# Patient Record
Sex: Female | Born: 1995
Health system: Southern US, Community
[De-identification: ages and names within clinical notes are randomized; demographics above are authoritative.]

## PROBLEM LIST (undated history)

## (undated) DIAGNOSIS — D649 Anemia, unspecified: Secondary | ICD-10-CM

## (undated) DIAGNOSIS — J45909 Unspecified asthma, uncomplicated: Secondary | ICD-10-CM

## (undated) DIAGNOSIS — R569 Unspecified convulsions: Secondary | ICD-10-CM

## (undated) DIAGNOSIS — I890 Lymphedema, not elsewhere classified: Secondary | ICD-10-CM

## (undated) DIAGNOSIS — G932 Benign intracranial hypertension: Secondary | ICD-10-CM

## (undated) DIAGNOSIS — F810 Specific reading disorder: Secondary | ICD-10-CM

## (undated) DIAGNOSIS — R51 Headache: Secondary | ICD-10-CM

## (undated) DIAGNOSIS — G4733 Obstructive sleep apnea (adult) (pediatric): Secondary | ICD-10-CM

## (undated) HISTORY — DX: Lymphedema, not elsewhere classified: I89.0

## (undated) HISTORY — DX: Benign intracranial hypertension: G93.2

## (undated) HISTORY — DX: Unspecified convulsions: R56.9

## (undated) HISTORY — PX: HERNIA REPAIR: SHX51

## (undated) HISTORY — DX: Headache: R51

## (undated) HISTORY — PX: ADENOIDECTOMY: SUR15

## (undated) HISTORY — DX: Unspecified asthma, uncomplicated: J45.909

## (undated) HISTORY — DX: Anemia, unspecified: D64.9

## (undated) HISTORY — DX: Obstructive sleep apnea (adult) (pediatric): G47.33

---
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
## 2015-01-11 ENCOUNTER — Ambulatory Visit: Payer: 59 | Admitting: Pulmonary Disease

## 2015-01-15 ENCOUNTER — Ambulatory Visit (INDEPENDENT_AMBULATORY_CARE_PROVIDER_SITE_OTHER): Payer: 59 | Admitting: Adult Health

## 2015-01-15 ENCOUNTER — Encounter: Payer: Self-pay | Admitting: Adult Health

## 2015-01-15 VITALS — BP 140/90 | HR 87 | Temp 98.4°F | Ht 67.0 in | Wt 285.4 lb

## 2015-01-15 DIAGNOSIS — G4733 Obstructive sleep apnea (adult) (pediatric): Secondary | ICD-10-CM | POA: Diagnosis not present

## 2015-01-15 NOTE — Assessment & Plan Note (Signed)
Severe sleep apnea with C Pap noncompliant Encouraged her to rethink wearing C Pap. However, she has sent her machine back to the company We'll try an oral appliance to see if this possibly may help somewhat Encouraged on weight loss Grandmother and patient advised on this. We'll try to attempt to call mother to advise her of recommendations.

## 2015-01-15 NOTE — Patient Instructions (Signed)
Will try oral appliance although this is not ideal .  Work on weight loss.  Follow up Dr. Vassie LollAlva  In 3-4  months

## 2015-01-15 NOTE — Progress Notes (Signed)
   Subjective:    Patient ID: Alicia Swanson, female    DOB: December 08, 1995, 19 y.o.   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 She had a seizure disorder since childhood, mild delayed milestones require special education. She underwent tonsillectomy Alicia as a child.She underwent nocturnal polysomnogram in 12/2008 showed severe OSA, with AHI of 75 events per hour including 33 central apneas, lowest desaturation was 60%. CPAP was tried, but she did not tolerate and mom did not insist on Alicia using it, and she was returned within a few days.   Titration study  >>CPAP 15 cm  pt's mom, she says that patient complains that CPAP causes Alicia to feel like she is smothering. She doesn't like anything on Alicia face and she will pull the mask off. Mom says that she noticed a change when she was using the CPAP and she has tried everything to get Alicia to keep the machine on, but she will not keep it on. She would like any help we can offer, she doesn't know what more to do. She has tried the nasal cannulas, nasal pillows and mask for nose and mouth but she still will not use it.  09/2014 Download on auto 5-15 - no residual, poor usage  Download shows poor usage with 67% , avg 2 hr  We discussed compliance and dangers of untreated OSA  She says it is the mask.  We discussed nasal pillows -thinks this might be better.  Denies excessive daytime sleepiness Does complain of insomnia .   01/15/2015 Follow up OSA  Patient returns with Alicia Swanson for follow-up for Alicia sleep apnea Patient has underlying known severe sleep apnea Unfortunately, she has not been wearing Alicia C Pap. She says that she did not like wearing the mask and she said Alicia machine back to the home care company. Discussed with Alicia and Alicia Swanson the importance of treating sleep apnea/ We discussed the complications of  untreated sleep apnea. Did discuss an oral appliance. However, advised this is not ideal treatment for severe sleep apnea. She was encouraged on weight loss.   Review of Systems neg for any significant sore throat, dysphagia, itching, sneezing, nasal congestion or excess/ purulent secretions, fever, chills, sweats, unintended wt loss, pleuritic or exertional cp, hempoptysis, orthopnea pnd or change in chronic leg swelling. Also denies presyncope, palpitations, heartburn, abdominal pain, nausea, vomiting, diarrhea or change in bowel or urinary habits, dysuria,hematuria, rash, arthralgias, visual complaints, headache, numbness weakness or ataxia.     Objective:   Physical Exam  Gen. Pleasant, obese, in no distress ENT - no lesions, no post nasal drip Neck: No JVD, no thyromegaly, no carotid bruits Lungs: no use of accessory muscles, no dullness to percussion, decreased without rales or rhonchi  Cardiovascular: Rhythm regular, heart sounds  normal, no murmurs or gallops, no peripheral edema Musculoskeletal: No deformities, no cyanosis or clubbing , no tremors        Assessment & Plan:

## 2015-01-17 NOTE — Addendum Note (Signed)
Addended by: Boone MasterJONES, Jasamine Pottinger E on: 01/17/2015 05:14 PM   Modules accepted: Orders

## 2015-01-17 NOTE — Progress Notes (Signed)
Reviewed & agree with plan  

## 2015-11-20 ENCOUNTER — Emergency Department (HOSPITAL_COMMUNITY)
Admission: EM | Admit: 2015-11-20 | Discharge: 2015-11-20 | Disposition: A | Discharge status: Home / Self Care | Payer: 59 | Attending: Emergency Medicine | Admitting: Emergency Medicine

## 2015-11-20 ENCOUNTER — Ambulatory Visit (HOSPITAL_COMMUNITY)
Admission: RE | Admit: 2015-11-20 | Discharge: 2015-11-20 | Disposition: A | Discharge status: Home / Self Care | Payer: 59 | Attending: Psychiatry | Admitting: Psychiatry

## 2015-11-20 ENCOUNTER — Encounter (HOSPITAL_COMMUNITY): Payer: Self-pay

## 2015-11-20 DIAGNOSIS — Z041 Encounter for examination and observation following transport accident: Secondary | ICD-10-CM | POA: Diagnosis present

## 2015-11-20 DIAGNOSIS — Z8249 Family history of ischemic heart disease and other diseases of the circulatory system: Secondary | ICD-10-CM | POA: Insufficient documentation

## 2015-11-20 DIAGNOSIS — R569 Unspecified convulsions: Secondary | ICD-10-CM | POA: Insufficient documentation

## 2015-11-20 DIAGNOSIS — R6250 Unspecified lack of expected normal physiological development in childhood: Secondary | ICD-10-CM

## 2015-11-20 DIAGNOSIS — F71 Moderate intellectual disabilities: Secondary | ICD-10-CM | POA: Insufficient documentation

## 2015-11-20 DIAGNOSIS — Z825 Family history of asthma and other chronic lower respiratory diseases: Secondary | ICD-10-CM | POA: Insufficient documentation

## 2015-11-20 DIAGNOSIS — Z8669 Personal history of other diseases of the nervous system and sense organs: Secondary | ICD-10-CM | POA: Diagnosis not present

## 2015-11-20 DIAGNOSIS — Y998 Other external cause status: Secondary | ICD-10-CM | POA: Diagnosis not present

## 2015-11-20 DIAGNOSIS — G4733 Obstructive sleep apnea (adult) (pediatric): Secondary | ICD-10-CM | POA: Diagnosis not present

## 2015-11-20 DIAGNOSIS — Z823 Family history of stroke: Secondary | ICD-10-CM | POA: Diagnosis not present

## 2015-11-20 DIAGNOSIS — Z820 Family history of epilepsy and other diseases of the nervous system: Secondary | ICD-10-CM | POA: Diagnosis not present

## 2015-11-20 DIAGNOSIS — Y92410 Unspecified street and highway as the place of occurrence of the external cause: Secondary | ICD-10-CM | POA: Insufficient documentation

## 2015-11-20 DIAGNOSIS — F88 Other disorders of psychological development: Secondary | ICD-10-CM | POA: Insufficient documentation

## 2015-11-20 DIAGNOSIS — R51 Headache: Secondary | ICD-10-CM | POA: Insufficient documentation

## 2015-11-20 DIAGNOSIS — F819 Developmental disorder of scholastic skills, unspecified: Secondary | ICD-10-CM | POA: Insufficient documentation

## 2015-11-20 DIAGNOSIS — Z880 Allergy status to penicillin: Secondary | ICD-10-CM | POA: Insufficient documentation

## 2015-11-20 DIAGNOSIS — Y9389 Activity, other specified: Secondary | ICD-10-CM | POA: Insufficient documentation

## 2015-11-20 DIAGNOSIS — J45909 Unspecified asthma, uncomplicated: Secondary | ICD-10-CM | POA: Insufficient documentation

## 2015-11-20 HISTORY — DX: Specific reading disorder: F81.0
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
## 2015-11-20 NOTE — Discharge Instructions (Signed)
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
## 2016-02-07 ENCOUNTER — Telehealth: Payer: Self-pay | Admitting: Pulmonary Disease

## 2016-02-07 DIAGNOSIS — G4733 Obstructive sleep apnea (adult) (pediatric): Secondary | ICD-10-CM

## 2016-02-07 NOTE — Telephone Encounter (Signed)
Patients mom says that she needs to find out information about the dental appliance, she said that she wants to try the dental appliance but thinks that they need a prior authorization through her insurance.  The CPAP did not work because of her claustrophobia. She has recently started wetting herself in the bed at night, not wanting to get up to go to the bathroom.  Mom is concerned about her sleeping habits and the fact that she lays in bed all the time but will not sleep.   Dr. Vassie LollAlva, please advise.

## 2016-02-08 NOTE — Telephone Encounter (Signed)
Refer to dentist who will do this if required Im not sure dental appliance will be helpful

## 2016-02-10 NOTE — Telephone Encounter (Signed)
Order entered. Patient's mom notified. Nothing further needed.  

## 2016-02-26 ENCOUNTER — Encounter: Payer: Self-pay | Admitting: Acute Care

## 2016-02-26 ENCOUNTER — Ambulatory Visit (INDEPENDENT_AMBULATORY_CARE_PROVIDER_SITE_OTHER): Payer: 59 | Admitting: Acute Care

## 2016-02-26 VITALS — BP 124/82 | HR 82 | Ht 67.0 in | Wt 262.0 lb

## 2016-02-26 DIAGNOSIS — G4733 Obstructive sleep apnea (adult) (pediatric): Secondary | ICD-10-CM

## 2016-02-26 NOTE — Assessment & Plan Note (Signed)
Severe Sleep Apnea CPAP Non-compliance. Considered mouth guard, but worsening symptoms of sleep deprivation, and wants to re-try CPAP. Psychological issues of anxiety and paranoia contributing factors. Plan: We will arrange for a CPAP machine with nasal prongs through your home health company. We will have the machine set at the same settings as previously used. Please also contact Behavioral Health for assistance with anxiety treatment as this will help with Alicia Swanson's ability to be compliant with the CPAP Machine. Goal is to wear the CPAP  for at least 4-6 hours each night for maximal clinical benefit. Continue to work on weight loss, as the link between excess weight  and sleep apnea is well established.  Do not drive if sleepy. Follow up with Dr. Vassie LollAlva or myself  In 1 month or before as needed.  Please contact office for sooner follow up if symptoms do not improve or worsen or seek emergency care

## 2016-02-26 NOTE — Progress Notes (Signed)
History of Present Illness Alicia Swanson is a 20 y.o. female with OSA followed by Dr. Vassie LollAlva.  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 She had a seizure disorder since childhood, mild delayed milestones require special education. She underwent tonsillectomy her as a child.She underwent nocturnal polysomnogram in 12/2008 showed severe OSA, with AHI of 75 events per hour including 33 central apneas, lowest desaturation was 60%. CPAP was tried, but she did not tolerate and mom did not insist on her using it, and she was returned within a few days.   02/26/2016  Acute OV: Pt. Returns to the office for worsening OSA. The patient is not sleeping at night at all. She is hallucinating during the day. She sees ants on the door, and will not come out of the room. She has even been urinating in her room because she is afraid to come out. This paranoia is new per her mother. She thinks it is due to sleep deprivation. Her mother states that they have never been contacted by Dr. Myrtis SerKatz office regarding the mouthguard, but she is aware that the mouthguard is not the best option for Alicia Swanson's severe sleep apnea.There is also a psychological component to these new behaviors..  Mom states that she plans to have [facility]. She hopes if the underlying psychological/ anxiety behaviors are treated then Alicia Swanson will be more compliant with the CPAP. We reviewed the risks of untreated sleep apnea.They would like to try the CPAP machine again but with the nasal prongs.She otherwise has no complaints. She denies fever, chest pain or tightness, cough, purulent secretions orthopnea or hemoptysis.  Tests  Titration study >>CPAP 15 cm  Past medical hx Past Medical History  Diagnosis Date  . Seizures (HCC)   . Headache(784.0)   . Asthma   . OSA (obstructive sleep apnea)   . Learning difficulty involving  reading      Past surgical hx, Family hx, Social hx all reviewed.  Current Outpatient Prescriptions on File Prior to Visit  Medication Sig  . ibuprofen (ADVIL,MOTRIN) 200 MG tablet Take 400 mg by mouth every 6 (six) hours as needed for headache, mild pain or moderate pain.   No current facility-administered medications on file prior to visit.       Review Of Systems:  Constitutional:   No  weight loss, night sweats,  Fevers, chills, fatigue, or  lassitude.  HEENT:   No headaches,  Difficulty swallowing,  Tooth/dental problems, or  Sore throat,                No sneezing, itching, ear ache, nasal congestion, post nasal drip,   CV:  No chest pain,  Orthopnea, PND, swelling in lower extremities, anasarca, dizziness, palpitations, syncope.   GI  No heartburn, indigestion, abdominal pain, nausea, vomiting, diarrhea, change in bowel habits, loss of appetite, bloody stools.   Resp: No shortness of breath with exertion or at rest.  No excess mucus, no productive cough,  No non-productive cough,  No coughing up of blood.  No change in color of mucus.  No wheezing.  No chest wall deformity  Skin: no rash or lesions.  GU: no dysuria, change in color of urine, no urgency or frequency.  No flank pain, no hematuria   MS:  No joint pain or swelling.  No decreased range of motion.  No back pain.  Psych:  No change in mood or affect. No depression or anxiety.  No memory loss.   Vital Signs BP 124/82 mmHg  Pulse 82  Ht  (1.702 m)  Wt 262 lb (118.842 kg)  BMI 41.03 kg/m2  SpO2 99%   Physical Exam:  General- No distress,  A&Ox3, flat affect ENT: No sinus tenderness, TM clear, pale nasal mucosa, no oral exudate,no post nasal drip, no LAN Cardiac: S1, S2, regular rate and rhythm, no murmur Chest: No wheeze/ rales/ dullness; no accessory muscle use, no nasal flaring, no sternal retractions Abd.: Soft Non-tender Ext: No clubbing cyanosis, edema Neuro:  normal strength Skin: No  rashes, warm and dry Psych: normal mood and behavior   Assessment/Plan  Obstructive sleep apnea Severe Sleep Apnea CPAP Non-compliance. Considered mouth guard, but worsening symptoms of sleep deprivation, and wants to re-try CPAP. Psychological issues of anxiety and paranoia contributing factors. Plan: We will arrange for a CPAP machine with nasal prongs through your home health company. We will have the machine set at the same settings as previously used. Please also contact Behavioral Health for assistance with anxiety treatment as this will help with Alicia Swanson's ability to be compliant with the CPAP Machine. Goal is to wear the CPAP  for at least 4-6 hours each night for maximal clinical benefit. Continue to work on weight loss, as the link between excess weight  and sleep apnea is well established.  Do not drive if sleepy. Follow up with Dr. Vassie Loll or myself  In 1 month or before as needed.  Please contact office for sooner follow up if symptoms do not improve or worsen or seek emergency care       Bevelyn Ngo, NP 02/26/2016  9:32 PM

## 2016-02-26 NOTE — Patient Instructions (Addendum)
It is good to see you today. We will arrange for a CPAP machine with nasal prongs through your home health company. We will have the machine set at the same settings as previously used. Please also contact Behavioral Health for assistance with anxiety treatment as this will help with [place]'s ability to be compliant with the CPAP Machine. Goal is to wear the CPAP  for at least 4-6 hours each night for maximal clinical benefit. Continue to work on weight loss, as the link between excess weight  and sleep apnea is well established.  Do not drive if sleepy. Follow up with Dr. Vassie LollAlva or myself  In 1 month or before as needed.  Please contact office for sooner follow up if symptoms do not improve or worsen or seek emergency care

## 2016-03-03 ENCOUNTER — Ambulatory Visit (INDEPENDENT_AMBULATORY_CARE_PROVIDER_SITE_OTHER): Payer: 59 | Admitting: Licensed Clinical Social Worker

## 2016-03-03 DIAGNOSIS — F422 Mixed obsessional thoughts and acts: Secondary | ICD-10-CM

## 2016-04-01 ENCOUNTER — Other Ambulatory Visit: Payer: Self-pay | Admitting: Acute Care

## 2016-04-01 ENCOUNTER — Encounter: Payer: 59 | Admitting: Acute Care

## 2016-04-01 DIAGNOSIS — G4733 Obstructive sleep apnea (adult) (pediatric): Secondary | ICD-10-CM

## 2016-04-01 NOTE — Progress Notes (Deleted)
   History of Present Illness Alicia Swanson is a 20 y.o. female with OSA followed by Dr. Vassie LollAlva   04/01/2016 Follow Up for OSA and re-attempt at CPAP use .  Tests ***  Past medical hx Past Medical History:  Diagnosis Date  . Asthma   . Headache(784.0)   . Learning difficulty involving reading   . OSA (obstructive sleep apnea)   . Seizures (HCC)      Past surgical hx, Family hx, Social hx all reviewed.  Current Outpatient Prescriptions on File Prior to Visit  Medication Sig  . ibuprofen (ADVIL,MOTRIN) 200 MG tablet Take 400 mg by mouth every 6 (six) hours as needed for headache, mild pain or moderate pain.   No current facility-administered medications on file prior to visit.      Allergies  Allergen Reactions  . Penicillins Rash    Has patient had a PCN reaction causing immediate rash, facial/tongue/throat swelling, SOB or lightheadedness with hypotension: {no-- pt did have small rash but no SOB or swelling} Has patient had a PCN reaction causing severe rash involving mucus membranes or skin necrosis: {no} Has patient had a PCN reaction that required hospitalization {no} Has patient had a PCN reaction occurring within the last 10 years: {no} If all of the above answers are "NO", then may proceed with Cephalosporin use.     Review Of Systems:  Constitutional:   No  weight loss, night sweats,  Fevers, chills, fatigue, or  lassitude.  HEENT:   No headaches,  Difficulty swallowing,  Tooth/dental problems, or  Sore throat,                No sneezing, itching, ear ache, nasal congestion, post nasal drip,   CV:  No chest pain,  Orthopnea, PND, swelling in lower extremities, anasarca, dizziness, palpitations, syncope.   GI  No heartburn, indigestion, abdominal pain, nausea, vomiting, diarrhea, change in bowel habits, loss of appetite, bloody stools.   Resp: No shortness of breath with exertion or at rest.  No excess mucus, no productive cough,  No non-productive cough,   No coughing up of blood.  No change in color of mucus.  No wheezing.  No chest wall deformity  Skin: no rash or lesions.  GU: no dysuria, change in color of urine, no urgency or frequency.  No flank pain, no hematuria   MS:  No joint pain or swelling.  No decreased range of motion.  No back pain.  Psych:  No change in mood or affect. No depression or anxiety.  No memory loss.   Vital Signs There were no vitals taken for this visit.   Physical Exam:  General- No distress,  A&Ox3 ENT: No sinus tenderness, TM clear, pale nasal mucosa, no oral exudate,no post nasal drip, no LAN Cardiac: S1, S2, regular rate and rhythm, no murmur Chest: No wheeze/ rales/ dullness; no accessory muscle use, no nasal flaring, no sternal retractions Abd.: Soft Non-tender Ext: No clubbing cyanosis, edema Neuro:  normal strength Skin: No rashes, warm and dry Psych: normal mood and behavior   Assessment/Plan  No problem-specific Assessment & Plan notes found for this encounter.    Bevelyn NgoSarah F Eulice Rutledge, NP 04/01/2016  12:05 PM

## 2016-04-03 ENCOUNTER — Telehealth: Payer: Self-pay | Admitting: Acute Care

## 2016-04-03 DIAGNOSIS — G4733 Obstructive sleep apnea (adult) (pediatric): Secondary | ICD-10-CM

## 2016-04-03 NOTE — Telephone Encounter (Signed)
Called and spoke with Melissa from [facility]---she stated that they need the order for her cpap to state what the settings are to be.  Pt turned her cpap in back in march.  Melissa thinks that the pt may have to start over.  Will forward to Maralyn SagoSarah to see what settings can be put on the order for the cpap.  Please advise. thanks

## 2016-04-03 NOTE — Telephone Encounter (Signed)
Alicia Swanson [phone] still hasnt gotten the cpap from [facility]

## 2016-04-07 NOTE — Telephone Encounter (Signed)
Please place an order for CPAP Auto Set 5-15 cm H2O. This is the last order I can see in the chart ( 12/2014). Thanks so much Mindy!!

## 2016-04-07 NOTE — Telephone Encounter (Signed)
Order has been placed.  [facility] Nothing further needed

## 2016-04-09 ENCOUNTER — Telehealth: Payer: Self-pay | Admitting: Acute Care

## 2016-04-09 NOTE — Telephone Encounter (Signed)
Spoke to [email or web address]  pt has an appt 04/13/16@2pm  ro get se up with the new cpap and she is aware I have spoken to her  Also Tobe SosSally E Swanson

## 2016-04-09 NOTE — Telephone Encounter (Signed)
New order was put in on 8/22 with cpap settings & [facility] know order was there & I had received confirmation back from her.  I just called [facility] & left vm for Jason to check order & to call me back with status.

## 2016-05-26 ENCOUNTER — Ambulatory Visit (INDEPENDENT_AMBULATORY_CARE_PROVIDER_SITE_OTHER): Payer: 59 | Admitting: Pulmonary Disease

## 2016-05-26 ENCOUNTER — Encounter: Payer: Self-pay | Admitting: Pulmonary Disease

## 2016-05-26 VITALS — BP 100/64 | HR 78 | Ht 67.0 in | Wt 264.4 lb

## 2016-05-26 DIAGNOSIS — G4733 Obstructive sleep apnea (adult) (pediatric): Secondary | ICD-10-CM | POA: Diagnosis not present

## 2016-05-26 DIAGNOSIS — Z23 Encounter for immunization: Secondary | ICD-10-CM

## 2016-05-26 NOTE — Assessment & Plan Note (Signed)
Weight loss again advised but clearly her behavioral issues seem to be the main hurdle

## 2016-05-26 NOTE — Assessment & Plan Note (Signed)
Use your CPAP machine when you are asleep for at least 4 hours per night  We will obtain report on your machine  Flu shot today  Weight loss encouraged, compliance with goal of at least 4-6 hrs every night is the expectation. Advised against medications with sedative side effects Cautioned against driving when sleepy - understanding that sleepiness will vary on a day to day basis

## 2016-05-26 NOTE — Progress Notes (Signed)
   Subjective:    Patient ID: Alicia Swanson, female    DOB: 1996-03-04, 20 y.o.   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 She had a seizure disorder since childhood, mild delayed milestones requiring special education. She underwent tonsillectomy as a child.   05/26/2016  Chief Complaint  Patient presents with  . Follow-up    doing better on CPAP, uses mostly every night, swelling in legs has gone down.    CPAP was tried initially but she did not tolerate She was then referred to the dentist for oral appliance but could not follow up with this. Mom felt that we should retry CPAP hence she was retried.  Grandmother accompanies today and has numerous questions since mom is at work. She had several behavioral issues and is now on trazodone and Zoloft. She states that she is still unable to use her CPAP and only uses about 2 hours sometimes when she is awake. She sleeps on her stomach and feels that nasal pillows gets in the way.  We discussed  sleep study results and benefits of CPAP  She has been unable to keep a job, did some waiting but was let go  Significant tests/ events   12/2008 NPSG >>severe OSA, with AHI of 75 events per hour including 33 central apneas, lowest desaturation was 60%.  06/2014 Titration study  >>CPAP 15 cm  Review of Systems neg for any significant sore throat, dysphagia, itching, sneezing, nasal congestion or excess/ purulent secretions, fever, chills, sweats, unintended wt loss, pleuritic or exertional cp, hempoptysis, orthopnea pnd or change in chronic leg swelling. Also denies presyncope, palpitations, heartburn, abdominal pain, nausea, vomiting, diarrhea or change in bowel or urinary habits, dysuria,hematuria, rash, arthralgias, visual complaints, headache, numbness weakness or ataxia.     Objective:   Physical Exam  Gen. Pleasant, obese, in no  distress ENT - no lesions, no post nasal drip Neck: No JVD, no thyromegaly, no carotid bruits Lungs: no use of accessory muscles, no dullness to percussion, decreased without rales or rhonchi  Cardiovascular: Rhythm regular, heart sounds  normal, no murmurs or gallops, no peripheral edema Musculoskeletal: No deformities, no cyanosis or clubbing , no tremors       Assessment & Plan:

## 2016-05-26 NOTE — Patient Instructions (Signed)
Use your CPAP machine when you are asleep for at least 4 hours per night  We will obtain report on your machine  Flu shot today

## 2016-05-27 ENCOUNTER — Ambulatory Visit: Payer: 59 | Admitting: Acute Care

## 2016-07-06 ENCOUNTER — Encounter: Payer: Self-pay | Admitting: Acute Care

## 2016-11-27 ENCOUNTER — Ambulatory Visit: Payer: Self-pay | Admitting: Adult Health

## 2016-11-30 ENCOUNTER — Ambulatory Visit: Payer: Self-pay | Admitting: Adult Health

## 2016-12-07 ENCOUNTER — Ambulatory Visit: Payer: Self-pay | Admitting: Adult Health

## 2016-12-16 ENCOUNTER — Ambulatory Visit: Payer: Self-pay | Admitting: Adult Health

## 2016-12-22 ENCOUNTER — Ambulatory Visit (INDEPENDENT_AMBULATORY_CARE_PROVIDER_SITE_OTHER): Payer: 59 | Admitting: Adult Health

## 2016-12-22 ENCOUNTER — Encounter: Payer: Self-pay | Admitting: Adult Health

## 2016-12-22 VITALS — BP 124/72 | HR 79 | Ht 66.0 in | Wt 269.8 lb

## 2016-12-22 DIAGNOSIS — G4733 Obstructive sleep apnea (adult) (pediatric): Secondary | ICD-10-CM

## 2016-12-22 NOTE — Patient Instructions (Addendum)
Set up for split night sleep study .  Healthy sleep regimen  Follow up with Dr. Vassie LollAlva  In 3 months and As needed

## 2016-12-22 NOTE — Assessment & Plan Note (Signed)
Severe OSA w/ CPAP noncompliance  Need to repeat sleep study to get requalified.  Advised on medicaton side effects.   Plan  Patient Instructions  Set up for split night sleep study .  Healthy sleep regimen  Follow up with Dr. Vassie LollAlva  In 3 months and As needed

## 2016-12-22 NOTE — Assessment & Plan Note (Signed)
Wt loss  

## 2016-12-22 NOTE — Progress Notes (Signed)
 @Patient  ID: Alicia Swanson, female    DOB: August 23, 1995, 21 y.o.   MRN: [id number]  Chief Complaint  Patient presents with  . Follow-up    OSA     Referring provider: Terressa KoyanagiKim, Hannah R, DO  HPI: 21 yo female followed for severe OSA  Seizure disorder . Has learning disability   TEST  12/2008 NPSG >>severe OSA, with AHI of 75 events per hour including 33 central apneas, lowest desaturation was 60%. 06/2014 Titration study >>CPAP 15 cm  12/22/2016 Follow up : OSA  Pt returns for follow up . She is accompanied by her mom who wants to have her checked again for sleep apnea. She has known severe OSA with previous sleep study showing AHI at 75/hr , severe desats and central apneas. She was not compliant with CPAP and struggled to wear mostly due to anxiety .  Mom says they having been working to get anxiety under control and is now better on Trazodone and Zoloft. They had to turn CPAP back in due to noncompliance and insurance would not cover. Marland Kitchen.  DME says she will need repeat sleep study to qualify again.  She has Daytime sleepiness. Unnsure if she snores.   Disability - delayed learning ability . Has not been able to keep job.        Immunization History  Administered Date(s) Administered  . Influenza,inj,Quad PF,36+ Mos 05/26/2016    Past Medical History:  Diagnosis Date  . Asthma   . Headache(784.0)   . Learning difficulty involving reading   . OSA (obstructive sleep apnea)   . Seizures (HCC)     Tobacco History: History  Smoking Status  . Never Smoker  Smokeless Tobacco  . Never Used   Counseling given: Not Answered   Outpatient Encounter Prescriptions as of 12/22/2016  Medication Sig  . ibuprofen (ADVIL,MOTRIN) 200 MG tablet Take 400 mg by mouth every 6 (six) hours as needed for headache, mild pain or moderate pain.  Marland Kitchen. sertraline (ZOLOFT) 100 MG tablet TAKE 1 TABLET(S) BY MOUTH AT BEDTIME FOR ANXIETY  . traZODone (DESYREL) 100 MG tablet TAKE 2 TABLET(S) BY  MOUTH AT BEDTIME FOR INSOMNIA   No facility-administered encounter medications on file as of 12/22/2016.      Review of Systems  Constitutional:   No  weight loss, night sweats,  Fevers, chills,  +fatigue, or  lassitude.  HEENT:   No headaches,  Difficulty swallowing,  Tooth/dental problems, or  Sore throat,                No sneezing, itching, ear ache, nasal congestion, post nasal drip,   CV:  No chest pain,  Orthopnea, PND, swelling in lower extremities, anasarca, dizziness, palpitations, syncope.   GI  No heartburn, indigestion, abdominal pain, nausea, vomiting, diarrhea, change in bowel habits, loss of appetite, bloody stools.   Resp: No shortness of breath with exertion or at rest.  No excess mucus, no productive cough,  No non-productive cough,  No coughing up of blood.  No change in color of mucus.  No wheezing.  No chest wall deformity  Skin: no rash or lesions.  GU: no dysuria, change in color of urine, no urgency or frequency.  No flank pain, no hematuria   MS:  No joint pain or swelling.  No decreased range of motion.  No back pain.    Physical Exam  BP 124/72 (BP Location: Right Arm, Cuff Size: Large)   Pulse 79   Ht 5'  6" (1.676 m)   Wt 269 lb 12.8 oz (122.4 kg)   SpO2 100%   BMI 43.55 kg/m   GEN: A/Ox3; pleasant , NAD, morbild obesity    HEENT:  [place]/AT,  EACs-clear, TMs-wnl, NOSE-clear, THROAT-clear, no lesions, no postnasal drip or exudate noted. Class 3 MP airway   NECK:  Supple w/ fair ROM; no JVD; normal carotid impulses w/o bruits; no thyromegaly or nodules palpated; no lymphadenopathy.    RESP  Clear  P & A; w/o, wheezes/ rales/ or rhonchi. no accessory muscle use, no dullness to percussion  CARD:  RRR, no m/r/g, no peripheral edema, pulses intact, no cyanosis or clubbing.  GI:   Soft & nt; nml bowel sounds; no organomegaly or masses detected.   Musco: Warm bil, no deformities or joint swelling noted.   Neuro: alert, no focal deficits noted.     Skin: Warm, no lesions or rashes    Lab Results:   BNP No results found for: BNP  ProBNP No results found for: PROBNP  Imaging: No results found.   Assessment & Plan:   Morbid obesity Wt loss   Obstructive sleep apnea Severe OSA w/ CPAP noncompliance  Need to repeat sleep study to get requalified.  Advised on medicaton side effects.   Plan  Patient Instructions  Set up for split night sleep study .  Healthy sleep regimen  Follow up with Dr. Vassie Loll  In 3 months and As needed         Rubye Oaks, NP 12/22/2016

## 2017-01-26 NOTE — Progress Notes (Signed)
Reviewed & agree with plan  

## 2017-02-02 ENCOUNTER — Telehealth: Payer: Self-pay | Admitting: *Deleted

## 2017-02-02 DIAGNOSIS — G4733 Obstructive sleep apnea (adult) (pediatric): Secondary | ICD-10-CM

## 2017-02-02 NOTE — Telephone Encounter (Signed)
Discussed with TP: okay for HST  Order placed

## 2017-02-02 NOTE — Telephone Encounter (Signed)
-----   Message from Tobe SosSally E Ottinger sent at 02/01/2017  4:37 PM EDT ----- [facility] has denied the in lab sleep study will you agree to a home study? If so I need an order Tobe SosSally E Ottinger

## 2017-02-12 ENCOUNTER — Encounter (HOSPITAL_BASED_OUTPATIENT_CLINIC_OR_DEPARTMENT_OTHER): Payer: Self-pay

## 2017-02-26 ENCOUNTER — Telehealth: Payer: Self-pay | Admitting: Adult Health

## 2017-03-01 NOTE — Telephone Encounter (Signed)
Cordelia PenSherry the pts mother stated that she was returning a call to you.

## 2017-03-01 NOTE — Telephone Encounter (Signed)
HST needs to be repeated per RA.  Spoke to pt's mother and I have her rescheduled for this Friday.  Nothing further needed.

## 2017-03-07 DIAGNOSIS — G4733 Obstructive sleep apnea (adult) (pediatric): Secondary | ICD-10-CM | POA: Diagnosis not present

## 2017-03-10 ENCOUNTER — Telehealth: Payer: Self-pay | Admitting: Pulmonary Disease

## 2017-03-10 ENCOUNTER — Other Ambulatory Visit: Payer: Self-pay | Admitting: *Deleted

## 2017-03-10 DIAGNOSIS — G4733 Obstructive sleep apnea (adult) (pediatric): Secondary | ICD-10-CM

## 2017-03-10 NOTE — Telephone Encounter (Signed)
Per RA, HST did not show OSA.  

## 2017-03-11 NOTE — Telephone Encounter (Signed)
Left message for patient to call back  

## 2017-03-26 ENCOUNTER — Encounter: Payer: Self-pay | Admitting: Pulmonary Disease

## 2017-03-26 ENCOUNTER — Ambulatory Visit (INDEPENDENT_AMBULATORY_CARE_PROVIDER_SITE_OTHER): Payer: 59 | Admitting: Pulmonary Disease

## 2017-03-26 VITALS — BP 116/82 | HR 94 | Ht 66.0 in | Wt 270.6 lb

## 2017-03-26 DIAGNOSIS — G4733 Obstructive sleep apnea (adult) (pediatric): Secondary | ICD-10-CM | POA: Diagnosis not present

## 2017-03-26 NOTE — Patient Instructions (Signed)
SPlit night study

## 2017-03-26 NOTE — Assessment & Plan Note (Signed)
Weight loss again encouraged but difficult to obtain here

## 2017-03-26 NOTE — Progress Notes (Signed)
   Subjective:    Patient ID: Alicia Swanson, female    DOB: November 20, 1995, 21 y.o.   MRN: [id number]  HPI   21 yo female followed for severe OSA  Seizure disorder . Has learning disability  She had a period of noncompliance with CPAP machine was turned in. Mother wanted to retry and DME stated that she would need another study to qualify again. Home study was repeated 02/2017 ,  HST  initially was not a good recording then repeated again and did not show significant OSA  Mom feels that we should retest since she is not doing well at all. She's been prescribed trazodone and Zoloft for anxiety and insomnia. She seems to sleep more in the daytime. She is really not taking her medications. Mom found a bunch of medicines in her drawer.   Significant tests/ events reviewed  12/2008 NPSG >>severe OSA, with AHI of 75 events per hour including 33 central apneas, lowest desaturation was 60%. 06/2014 Titration study >>CPAP 15 cm     Review of Systems     Objective:   Physical Exam        Assessment & Plan:

## 2017-03-26 NOTE — Assessment & Plan Note (Signed)
We will obtain attended PSG and if she has significant sleep disorder breathing, we will reinstate CPAP with auto settings and 15 cm with a small fullface mask. Okay to use trazodone on the night of the study  Clearly the challenge here is her behavioral issues and whether she will be compliant with CPAP or not. She is definitely not a candidate for oral appliance. Wonder if she may be a candidate for the inspire device

## 2017-05-21 ENCOUNTER — Ambulatory Visit (HOSPITAL_BASED_OUTPATIENT_CLINIC_OR_DEPARTMENT_OTHER): Payer: 59 | Attending: Pulmonary Disease | Admitting: Pulmonary Disease

## 2017-05-21 VITALS — Ht 66.0 in | Wt 270.0 lb

## 2017-05-21 DIAGNOSIS — G4733 Obstructive sleep apnea (adult) (pediatric): Secondary | ICD-10-CM | POA: Insufficient documentation

## 2017-05-28 ENCOUNTER — Telehealth: Payer: Self-pay | Admitting: Pulmonary Disease

## 2017-05-28 DIAGNOSIS — G4733 Obstructive sleep apnea (adult) (pediatric): Secondary | ICD-10-CM | POA: Diagnosis not present

## 2017-05-28 NOTE — Procedures (Signed)
Patient Name: Alicia Swanson, Jhaveri Date: 05/21/2017 Gender: Female D.O.B: [birth year] Age (years): 21 Referring Provider: Cyril Mourning MD, ABSM Height (inches): 66 Interpreting Physician: Cyril Mourning MD, ABSM Weight (lbs): 270 RPSGT: Cherylann Parr BMI: 44 MRN: [id number] Neck Size: 15.50   CLINICAL INFORMATION Sleep Study Type: NPSG  Indication for sleep study: Obesity, OSA, Snoring, Witnessed Apneas  Epworth Sleepiness Score: 7  SLEEP STUDY TECHNIQUE As per the AASM Manual for the Scoring of Sleep and Associated Events v2.3 (April 2016) with a hypopnea requiring 4% desaturations.  The channels recorded and monitored were frontal, central and occipital EEG, electrooculogram (EOG), submentalis EMG (chin), nasal and oral airflow, thoracic and abdominal wall motion, anterior tibialis EMG, snore microphone, electrocardiogram, and pulse oximetry.  MEDICATIONS Medications self-administered by patient taken the night of the study : TRAZODONE, SERTRALINE  SLEEP ARCHITECTURE The study was initiated at 10:12:44 PM and ended at 4:21:34 AM.  Sleep onset time was 15.2 minutes and the sleep efficiency was 88.5%. The total sleep time was 326.5 minutes.  Stage REM latency was 111.0 minutes.  The patient spent 11.79% of the night in stage N1 sleep, 75.04% in stage N2 sleep, 9.34% in stage N3 and 3.83% in REM.  Alpha intrusion was absent.  Supine sleep was 100.00%.  RESPIRATORY PARAMETERS The overall apnea/hypopnea index (AHI) was 11.2 per hour. There were 9 total apneas, including 9 obstructive, 0 central and 0 mixed apneas. There were 52 hypopneas and 5 RERAs.  The AHI during Stage REM sleep was 24.0 per hour.  AHI while supine was 11.2 per hour.  The mean oxygen saturation was 97.35%. The minimum SpO2 during sleep was 91.00%.  soft snoring was noted during this study.  CARDIAC DATA The 2 lead EKG demonstrated sinus rhythm. The mean heart rate was 76.47 beats per minute. Other  EKG findings include: None.   LEG MOVEMENT DATA The total PLMS were 0 with a resulting PLMS index of 0.00. Associated arousal with leg movement index was 0.0 .  IMPRESSIONS - Mild obstructive sleep apnea occurred during this study (AHI = 11.2/h). - No significant central sleep apnea occurred during this study (CAI = 0.0/h). - The patient had minimal or no oxygen desaturation during the study (Min O2 = 91.00%) - The patient snored with soft snoring volume. - No cardiac abnormalities were noted during this study. - Clinically significant periodic limb movements did not occur during sleep. No significant associated arousals.   DIAGNOSIS - Obstructive Sleep Apnea (327.23 [G47.33 ICD-10])    RECOMMENDATIONS - Therapeutic CPAP titration to determine optimal pressure required to alleviate sleep disordered breathing. Alternatively, autoCPAP could be tried - Positional therapy avoiding supine position during sleep. - Avoid alcohol, sedatives and other CNS depressants that may worsen sleep apnea and disrupt normal sleep architecture. - Sleep hygiene should be reviewed to assess factors that may improve sleep quality. - Weight management and regular exercise should be initiated or continued if appropriate.   Cyril Mourning MD Board Certified in Sleep medicine

## 2017-05-28 NOTE — Telephone Encounter (Signed)
PSG showed mild OSA 12/h No CPAP & wt loss alone could be an option  If mom is insistent, then we can trial autoCPAP again

## 2017-05-28 NOTE — Telephone Encounter (Signed)
Left message for pt to call us back. 

## 2017-06-05 ENCOUNTER — Emergency Department (HOSPITAL_COMMUNITY)
Admission: EM | Admit: 2017-06-05 | Discharge: 2017-06-05 | Discharge status: Against Medical Advice | Payer: 59 | Attending: Emergency Medicine | Admitting: Emergency Medicine

## 2017-06-05 ENCOUNTER — Encounter (HOSPITAL_COMMUNITY): Payer: Self-pay | Admitting: Emergency Medicine

## 2017-06-05 DIAGNOSIS — R55 Syncope and collapse: Secondary | ICD-10-CM | POA: Insufficient documentation

## 2017-06-05 DIAGNOSIS — Z5321 Procedure and treatment not carried out due to patient leaving prior to being seen by health care provider: Secondary | ICD-10-CM | POA: Insufficient documentation

## 2017-06-05 LAB — CBC
HEMATOCRIT: 30.3 % — AB (ref 36.0–46.0)
Hemoglobin: 8.4 g/dL — ABNORMAL LOW (ref 12.0–15.0)
MCH: 18.5 pg — ABNORMAL LOW (ref 26.0–34.0)
MCHC: 27.7 g/dL — AB (ref 30.0–36.0)
MCV: 66.6 fL — ABNORMAL LOW (ref 78.0–100.0)
PLATELETS: 199 10*3/uL (ref 150–400)
RBC: 4.55 MIL/uL (ref 3.87–5.11)
RDW: 17.4 % — AB (ref 11.5–15.5)
WBC: 7.8 10*3/uL (ref 4.0–10.5)

## 2017-06-05 LAB — BASIC METABOLIC PANEL
Anion gap: 9 (ref 5–15)
BUN: 8 mg/dL (ref 6–20)
CALCIUM: 9 mg/dL (ref 8.9–10.3)
CO2: 27 mmol/L (ref 22–32)
CREATININE: 0.6 mg/dL (ref 0.44–1.00)
Chloride: 106 mmol/L (ref 101–111)
GFR calc Af Amer: >60 mL/min (ref >60)
GLUCOSE: 92 mg/dL (ref 65–99)
Potassium: 3.8 mmol/L (ref 3.5–5.1)
Sodium: 142 mmol/L (ref 135–145)

## 2017-06-05 LAB — I-STAT BETA HCG BLOOD, ED (MC, WL, AP ONLY)

## 2017-06-05 LAB — CBG MONITORING, ED: GLUCOSE-CAPILLARY: 89 mg/dL (ref 65–99)

## 2017-06-05 NOTE — ED Triage Notes (Signed)
Patient presents with mother after having a fall down the stairs. Mother heard a loud thump and went to find the pt. Mother states pt was unconscious when she found her. Pt does not remember what happened. Pt has hematoma to left forehead.

## 2017-06-05 NOTE — ED Notes (Signed)
Pt told registration they no longer wanted to wait to be seen. 

## 2017-06-10 NOTE — Telephone Encounter (Signed)
Ok and pt knows to make appt in 6 weeks after set up with TP.

## 2017-06-10 NOTE — Telephone Encounter (Signed)
auto CPAP 8-15 cm with a small fullface mask. DL in 4 wks OV with TP in 6 wks

## 2017-06-10 NOTE — Telephone Encounter (Signed)
Patient mom Jamal Collinleana called back - she can be reached at [phone] -pr

## 2017-06-10 NOTE — Telephone Encounter (Signed)
Spoke with mom, advised mom and she wants to try the CPAP. RA which setting would you like for the CPAP?.Marland Kitchen

## 2017-08-24 ENCOUNTER — Ambulatory Visit: Payer: Self-pay | Admitting: Pulmonary Disease

## 2017-09-14 ENCOUNTER — Ambulatory Visit (INDEPENDENT_AMBULATORY_CARE_PROVIDER_SITE_OTHER): Payer: 59 | Admitting: Pulmonary Disease

## 2017-09-14 ENCOUNTER — Encounter: Payer: Self-pay | Admitting: Pulmonary Disease

## 2017-09-14 DIAGNOSIS — G4733 Obstructive sleep apnea (adult) (pediatric): Secondary | ICD-10-CM

## 2017-09-14 NOTE — Progress Notes (Signed)
   Subjective:    Patient ID: Alicia Swanson, female    DOB: 1995/11/08, 22 y.o.   MRN: [id number]  HPI  22 yo female followed for severe OSA  Seizure disorder . Has learning disability   After from a sleep study she was started on auto CPAP 8-15 cm with a small fullface mask In October 2018.  At this time she seems to have tolerated much better and is able to use it on average of 5 hours per night. CPAP download was reviewed which shows residual AHI of 23/hour-events last event is unknown mostly apneas, few obstructive apneas mother states there is some problem with the tubing coming off and she has taped it up until she obtains new tubing from DME  .  She will also obtain a new mask at bedtime. Unfortunately she has gained significant weight to her current weight of about 300 pounds.  She seems to be eating fast food with her pocket money.  Is we will she denies problems with mask or pressure, denies mild dryness in the   Significant tests/ events reviewed  12/2008 NPSG >>severe OSA, with AHI of 75 events per hour including 33 central apneas, lowest desaturation was 60%. 06/2014 Titration study >>CPAP 15 cm  Review of Systems Patient denies significant dyspnea,cough, hemoptysis,  chest pain, palpitations, pedal edema, orthopnea, paroxysmal nocturnal dyspnea, lightheadedness, nausea, vomiting, abdominal or  leg pains      Objective:   Physical Exam  Gen. Pleasant, obese, in no distress ENT - no lesions, no post nasal drip Neck: No JVD, no thyromegaly, no carotid bruits Lungs: no use of accessory muscles, no dullness to percussion, decreased without rales or rhonchi  Cardiovascular: Rhythm regular, heart sounds  normal, no murmurs or gallops, no peripheral edema Musculoskeletal: No deformities, no cyanosis or clubbing , no tremors       Assessment & Plan:

## 2017-09-14 NOTE — Patient Instructions (Signed)
CPAP machine is working well on auto settings although a few events persist   weight loss recommended

## 2017-09-14 NOTE — Assessment & Plan Note (Signed)
CPAP machine is working well on auto settings although a few events persist  Do not feel that increasing pressure will be tolerated by her at this time I am just pleased that she is able to tolerate the CPAP for as long as she has so much better than her last  Weight loss encouraged, compliance with goal of at least 4-6 hrs every night is the expectation. Advised against medications with sedative side effects Cautioned against driving when sleepy - understanding that sleepiness will vary on a day to day basis

## 2017-09-14 NOTE — Assessment & Plan Note (Signed)
Weight loss recommended Discussed diet and exercise

## 2018-03-14 ENCOUNTER — Ambulatory Visit: Payer: Self-pay | Admitting: Adult Health

## 2018-04-08 ENCOUNTER — Encounter: Payer: Self-pay | Admitting: Nurse Practitioner

## 2018-04-08 ENCOUNTER — Ambulatory Visit (INDEPENDENT_AMBULATORY_CARE_PROVIDER_SITE_OTHER): Payer: 59 | Admitting: Nurse Practitioner

## 2018-04-08 VITALS — BP 130/86 | HR 84 | Ht 66.0 in | Wt 306.0 lb

## 2018-04-08 DIAGNOSIS — Z6841 Body Mass Index (BMI) 40.0 and over, adult: Secondary | ICD-10-CM | POA: Diagnosis not present

## 2018-04-08 DIAGNOSIS — G4733 Obstructive sleep apnea (adult) (pediatric): Secondary | ICD-10-CM | POA: Diagnosis not present

## 2018-04-08 NOTE — Progress Notes (Signed)
@Patient  ID: Alicia Swanson, female    DOB: [birth year], 22 y.o.   MRN: [id number]  Chief Complaint  Patient presents with  . Follow-up    6 month     Referring provider: Knox Royalty, MD  HPI   22 year old female never smoker followed for severe OSA followed by Dr. Vassie Loll.   Tests: 12/2008 NPSG >>severe OSA, with AHI of 75 events per hour including 33 central apneas, lowest desaturation was 60%. 06/2014 Titration study >>CPAP 15 cm  OV 04-08-18 Patient presents for follow up on sleep apnea. She has not been compliant with her CPAP. Only used it 6/90 days. States that her CPAP has a bad odor coming from the reservoir and she can not tolerate it. States that she has a new job. Working 2:00-10:00 pm. She denies excessive daytime sleepiness. Denies shortness of breath or wheezing.   Compliance report: 01-09-18 - 04-08-18 Usage days 6/90 days, average usage 1 hour 30 mins, Autoset 8-15 cm H2O, AHI:30.5  Allergies  Allergen Reactions  . Penicillins Rash    Has patient had a PCN reaction causing immediate rash, facial/tongue/throat swelling, SOB or lightheadedness with hypotension: no- rash Has patient had a PCN reaction causing severe rash involving mucus membranes or skin necrosis: no Has patient had a PCN reaction that required hospitalization no Has patient had a PCN reaction occurring within the last 10 years: no If all of the above answers are "NO", then may proceed with Cephalosporin use.     Immunization History  Administered Date(s) Administered  . Influenza,inj,Quad PF,6+ Mos 05/26/2016    Past Medical History:  Diagnosis Date  . Asthma   . Headache(784.0)   . Learning difficulty involving reading   . OSA (obstructive sleep apnea)   . Seizures (HCC)     Tobacco History: Social History   Tobacco Use  Smoking Status Never Smoker  Smokeless Tobacco Never Used   Counseling given: Yes   Outpatient Encounter Medications as of 04/08/2018  Medication Sig  .  ibuprofen (ADVIL,MOTRIN) 200 MG tablet Take 400 mg by mouth every 6 (six) hours as needed for headache, mild pain or moderate pain.  Marland Kitchen sertraline (ZOLOFT) 100 MG tablet TAKE 1 TABLET(S) BY MOUTH AT BEDTIME FOR ANXIETY  . traZODone (DESYREL) 100 MG tablet TAKE 2 TABLET(S) BY MOUTH AT BEDTIME FOR INSOMNIA   No facility-administered encounter medications on file as of 04/08/2018.      Review of Systems  Review of Systems  Constitutional: Negative.   HENT: Negative.   Respiratory: Negative for cough and shortness of breath.   Cardiovascular: Negative.   Gastrointestinal: Negative.   Allergic/Immunologic: Negative.   Neurological: Negative.   Psychiatric/Behavioral: Negative.        Physical Exam  BP 130/86 (BP Location: Left Arm, Patient Position: Sitting, Cuff Size: Large)   Pulse 84   Ht 5\' 6"  (1.676 m)   Wt (!) 306 lb (138.8 kg)   SpO2 99%   BMI 49.39 kg/m   Wt Readings from Last 5 Encounters:  04/08/18 (!) 306 lb (138.8 kg)  09/14/17 299 lb (135.6 kg)  06/05/17 286 lb (129.7 kg)  05/21/17 270 lb (122.5 kg)  03/26/17 270 lb 9.6 oz (122.7 kg)     Physical Exam  Constitutional: She is oriented to person, place, and time. She appears well-developed and well-nourished. No distress.  obese  HENT:  MP 3 airway  Cardiovascular: Normal rate and regular rhythm.  Pulmonary/Chest: Effort normal and breath sounds normal.  Neurological: She is alert and oriented to person, place, and time.  Psychiatric: She has a normal mood and affect.  Nursing note and vitals reviewed.    Assessment & Plan:   Obstructive sleep apnea Patient Instructions  Discussed importance of wearing CPAP each night Goal is to wear nightly for at least 4 hours Work on healthy weight Do not drive if drowsy Follow up with Dr. Vassie LollAlva in 1 month  Will order new reservoir for CPAP Discussed associated health risks with sleep apnea and benefits of compliance   Obesity, unspecified Discussed being  more active Healthy diet Encouraged weight loss with OSA     Ivonne Andrewonya S Kemal Amores, NP 04/11/2018

## 2018-04-08 NOTE — Assessment & Plan Note (Signed)
Discussed being more active Healthy diet Encouraged weight loss with OSA

## 2018-04-08 NOTE — Assessment & Plan Note (Signed)
Patient Instructions  Discussed importance of wearing CPAP each night Goal is to wear nightly for at least 4 hours Work on healthy weight Do not drive if drowsy Follow up with Dr. Vassie LollAlva in 1 month  Will order new reservoir for CPAP Discussed associated health risks with sleep apnea and benefits of compliance

## 2018-04-08 NOTE — Patient Instructions (Addendum)
Discussed importance of wearing CPAP each night Goal is to wear nightly for at least 4 hours Work on healthy weight Do not drive if drowsy Follow up with Dr. Vassie LollAlva in 1 month

## 2018-05-05 ENCOUNTER — Telehealth: Payer: Self-pay | Admitting: Pulmonary Disease

## 2018-05-05 ENCOUNTER — Ambulatory Visit: Payer: Self-pay | Admitting: Pulmonary Disease

## 2018-05-05 NOTE — Telephone Encounter (Signed)
Spoke with patients mother Percival Spanishlana, she is wanting to know whether or not to cancel the appointment as the patient has not received the reservoir for her cpap machine. [facility] per Barbara CowerJason if she does not have reservoir by next week please call us back. Informed [name] and instructed her to call our office once she receives the reservoir so that we can reschedule the appointment. Nothing further needed at this time.

## 2020-02-07 ENCOUNTER — Emergency Department (HOSPITAL_COMMUNITY)
Admission: EM | Admit: 2020-02-07 | Discharge: 2020-02-08 | Discharge status: Against Medical Advice | Payer: 59 | Source: Home / Self Care

## 2020-02-07 ENCOUNTER — Other Ambulatory Visit: Payer: Self-pay

## 2020-02-07 ENCOUNTER — Encounter (HOSPITAL_COMMUNITY): Payer: Self-pay | Admitting: Emergency Medicine

## 2020-02-07 DIAGNOSIS — G932 Benign intracranial hypertension: Secondary | ICD-10-CM | POA: Diagnosis not present

## 2020-02-07 LAB — CBC
HCT: 26.3 % — ABNORMAL LOW (ref 36.0–46.0)
Hemoglobin: 6.6 g/dL — CL (ref 12.0–15.0)
MCH: 16.6 pg — ABNORMAL LOW (ref 26.0–34.0)
MCHC: 25.1 g/dL — ABNORMAL LOW (ref 30.0–36.0)
MCV: 66.1 fL — ABNORMAL LOW (ref 80.0–100.0)
Platelets: 279 10*3/uL (ref 150–400)
RBC: 3.98 MIL/uL (ref 3.87–5.11)
RDW: 20.7 % — ABNORMAL HIGH (ref 11.5–15.5)
WBC: 7.7 10*3/uL (ref 4.0–10.5)
nRBC: 0.7 % — ABNORMAL HIGH (ref 0.0–0.2)

## 2020-02-07 LAB — BASIC METABOLIC PANEL
Anion gap: 9 (ref 5–15)
BUN: 6 mg/dL (ref 6–20)
CO2: 28 mmol/L (ref 22–32)
Calcium: 8.5 mg/dL — ABNORMAL LOW (ref 8.9–10.3)
Chloride: 102 mmol/L (ref 98–111)
Creatinine, Ser: 0.53 mg/dL (ref 0.44–1.00)
GFR calc Af Amer: >60 mL/min (ref >60)
GFR calc non Af Amer: >60 mL/min (ref >60)
Glucose, Bld: 112 mg/dL — ABNORMAL HIGH (ref 70–99)
Potassium: 3.2 mmol/L — ABNORMAL LOW (ref 3.5–5.1)
Sodium: 139 mmol/L (ref 135–145)

## 2020-02-07 NOTE — ED Triage Notes (Signed)
Patient arrives to ED with complaints of blurred vision in both eye for the past two weeks. Went to Opthalmologist today and was told there he has bilateral disc edema and sent to ED for MRI. Patient has no complaints of pain at this time.

## 2020-02-08 NOTE — ED Notes (Signed)
Pt has now been called X2 for vitals check with no reply

## 2020-02-09 ENCOUNTER — Emergency Department (HOSPITAL_COMMUNITY): Payer: 59

## 2020-02-09 ENCOUNTER — Other Ambulatory Visit: Payer: Self-pay

## 2020-02-09 ENCOUNTER — Encounter (HOSPITAL_COMMUNITY): Payer: Self-pay

## 2020-02-09 ENCOUNTER — Inpatient Hospital Stay (HOSPITAL_COMMUNITY)
Admit: 2020-02-09 | Discharge: 2020-02-11 | DRG: 103 | Disposition: A | Discharge status: Home / Self Care | Payer: 59 | Attending: Internal Medicine | Admitting: Internal Medicine

## 2020-02-09 DIAGNOSIS — Z20822 Contact with and (suspected) exposure to covid-19: Secondary | ICD-10-CM | POA: Diagnosis present

## 2020-02-09 DIAGNOSIS — D500 Iron deficiency anemia secondary to blood loss (chronic): Secondary | ICD-10-CM | POA: Diagnosis present

## 2020-02-09 DIAGNOSIS — G932 Benign intracranial hypertension: Secondary | ICD-10-CM | POA: Diagnosis present

## 2020-02-09 DIAGNOSIS — Z6836 Body mass index (BMI) 36.0-36.9, adult: Secondary | ICD-10-CM | POA: Diagnosis not present

## 2020-02-09 DIAGNOSIS — D649 Anemia, unspecified: Secondary | ICD-10-CM

## 2020-02-09 DIAGNOSIS — G4733 Obstructive sleep apnea (adult) (pediatric): Secondary | ICD-10-CM | POA: Diagnosis present

## 2020-02-09 DIAGNOSIS — F70 Mild intellectual disabilities: Secondary | ICD-10-CM | POA: Diagnosis present

## 2020-02-09 DIAGNOSIS — N920 Excessive and frequent menstruation with regular cycle: Secondary | ICD-10-CM | POA: Diagnosis present

## 2020-02-09 DIAGNOSIS — K047 Periapical abscess without sinus: Secondary | ICD-10-CM | POA: Diagnosis present

## 2020-02-09 LAB — CBC WITH DIFFERENTIAL/PLATELET
Abs Immature Granulocytes: 0.01 10*3/uL (ref 0.00–0.07)
Basophils Absolute: 0 10*3/uL (ref 0.0–0.1)
Basophils Relative: 1 %
Eosinophils Absolute: 0.1 10*3/uL (ref 0.0–0.5)
Eosinophils Relative: 2 %
HCT: 26.2 % — ABNORMAL LOW (ref 36.0–46.0)
Hemoglobin: 6.5 g/dL — CL (ref 12.0–15.0)
Immature Granulocytes: 0 %
Lymphocytes Relative: 50 %
Lymphs Abs: 2.9 10*3/uL (ref 0.7–4.0)
MCH: 16.5 pg — ABNORMAL LOW (ref 26.0–34.0)
MCHC: 24.8 g/dL — ABNORMAL LOW (ref 30.0–36.0)
MCV: 66.7 fL — ABNORMAL LOW (ref 80.0–100.0)
Monocytes Absolute: 0.5 10*3/uL (ref 0.1–1.0)
Monocytes Relative: 8 %
Neutro Abs: 2.2 10*3/uL (ref 1.7–7.7)
Neutrophils Relative %: 39 %
Platelets: 369 10*3/uL (ref 150–400)
RBC: 3.93 MIL/uL (ref 3.87–5.11)
RDW: 21 % — ABNORMAL HIGH (ref 11.5–15.5)
WBC: 5.7 10*3/uL (ref 4.0–10.5)
nRBC: 0.9 % — ABNORMAL HIGH (ref 0.0–0.2)

## 2020-02-09 LAB — COMPREHENSIVE METABOLIC PANEL
ALT: 11 U/L (ref 0–44)
AST: 18 U/L (ref 15–41)
Albumin: 3.5 g/dL (ref 3.5–5.0)
Alkaline Phosphatase: 59 U/L (ref 38–126)
Anion gap: 10 (ref 5–15)
BUN: 6 mg/dL (ref 6–20)
CO2: 26 mmol/L (ref 22–32)
Calcium: 8.6 mg/dL — ABNORMAL LOW (ref 8.9–10.3)
Chloride: 106 mmol/L (ref 98–111)
Creatinine, Ser: 0.45 mg/dL (ref 0.44–1.00)
GFR calc Af Amer: >60 mL/min (ref >60)
GFR calc non Af Amer: >60 mL/min (ref >60)
Glucose, Bld: 82 mg/dL (ref 70–99)
Potassium: 3.7 mmol/L (ref 3.5–5.1)
Sodium: 142 mmol/L (ref 135–145)
Total Bilirubin: 0.5 mg/dL (ref 0.3–1.2)
Total Protein: 6.9 g/dL (ref 6.5–8.1)

## 2020-02-09 LAB — RETICULOCYTES
Immature Retic Fract: 32.7 % — ABNORMAL HIGH (ref 2.3–15.9)
RBC.: 3.89 MIL/uL (ref 3.87–5.11)
Retic Count, Absolute: 60.3 10*3/uL (ref 19.0–186.0)
Retic Ct Pct: 1.6 % (ref 0.4–3.1)

## 2020-02-09 LAB — I-STAT BETA HCG BLOOD, ED (MC, WL, AP ONLY): I-stat hCG, quantitative: <5 m[IU]/mL (ref <5)

## 2020-02-09 IMAGING — MR MR HEAD W/O CM
11 series · 48 of 48 positions shown · non-contrast
Comparison: None.

CLINICAL DATA: Headache, blurry vision

EXAM:
MRI HEAD AND ORBITS WITHOUT CONTRAST
MRV HEAD WITHOUT CONTRAST
TECHNIQUE: Multiplanar, multiecho pulse sequences of the brain and surrounding
structures were obtained without intravenous contrast. Multiplanar,
multiecho pulse sequences of the orbits and surrounding structures
were obtained including fat saturation techniques, without
intravenous contrast administration.
Angiographic images of the intracranial venous structures were
obtained using MRV technique without intravenous contrast.

[Series 13: DWI · axial · 3.0mm · 1.36mm/px · z∈[-13,+139]mm · 7 of 104 slices shown (1 of 4)]
[im 1/104]
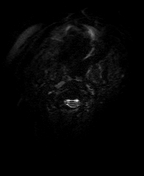
[im 18/104]
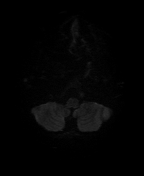
[im 35/104]
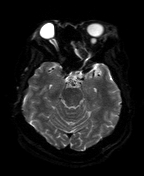
[im 52/104]
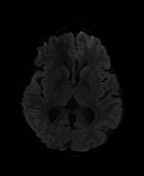
[im 69/104]
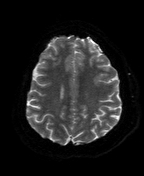
[im 86/104]
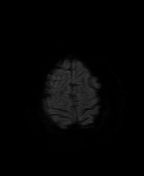
[im 104/104]
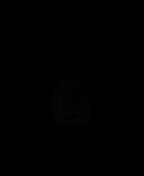

[Series 14: DWI · axial · 3.0mm · 1.36mm/px · z∈[-13,+139]mm · 3 of 52 slices shown (2 of 4)]
[im 1/52]
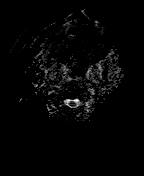
[im 26/52]
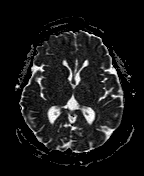
[im 52/52]
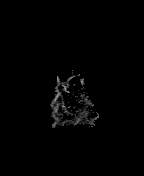

[Series 15: T2 · axial · 5.0mm · 0.62mm/px · z∈[-8,+140]mm · 2 of 24 slices shown (1 of 2)]
[im 1/24]
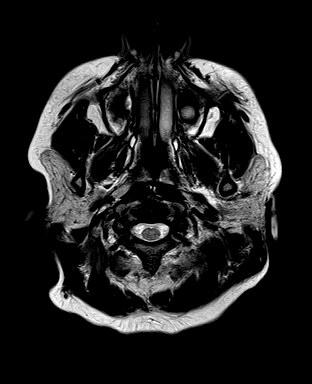
[im 24/24]
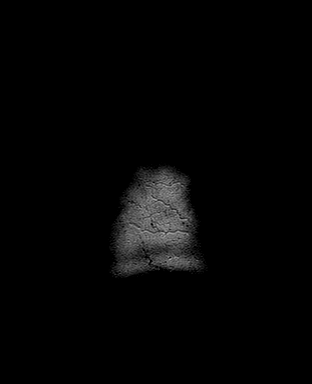

[Series 16: mip_images(sw) · axial · 24.0mm · 0.75mm/px · z∈[+0,+131]mm · 3 of 45 slices shown]
[im 1/45]
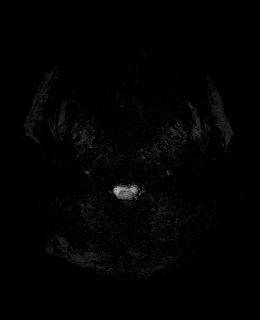
[im 23/45]
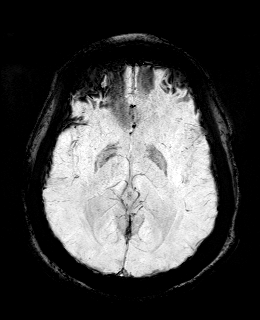
[im 45/45]
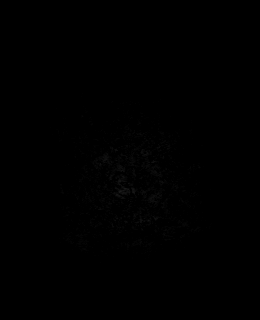

[Series 17: swi_images · axial · 3.0mm · 0.75mm/px · z∈[-10,+142]mm · 4 of 52 slices shown]
[im 1/52]
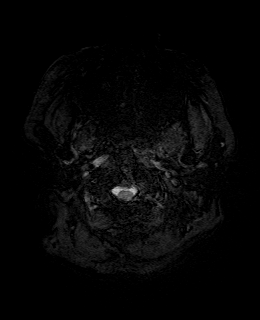
[im 18/52]
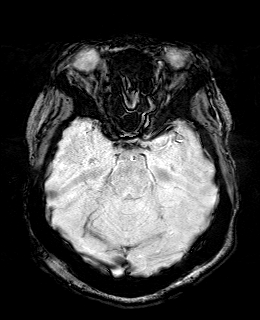
[im 35/52]
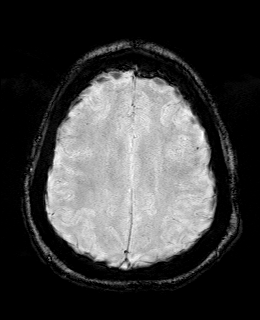
[im 52/52]
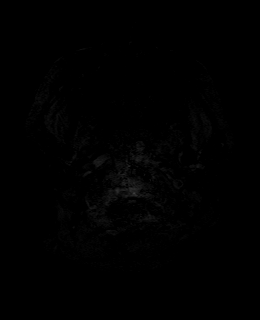

[Series 18: FLAIR · axial · 3.0mm · 0.75mm/px · z∈[-10,+142]mm · 4 of 52 slices shown]
[im 1/52]
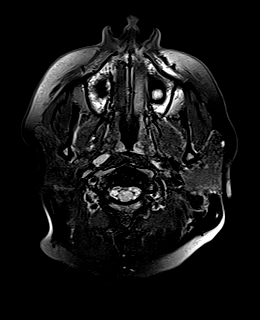
[im 18/52]
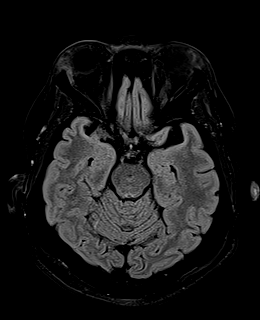
[im 35/52]
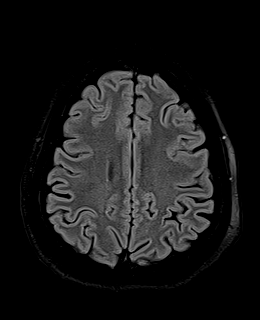
[im 52/52]
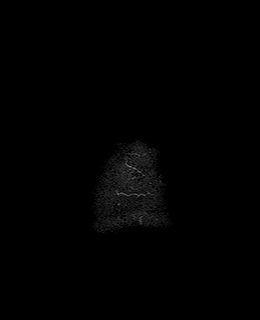

[Series 19: T1 · axial · 1.0mm · 0.94mm/px · z∈[-24,+134]mm · 12 of 160 slices shown (1 of 2)]
[im 1/160]
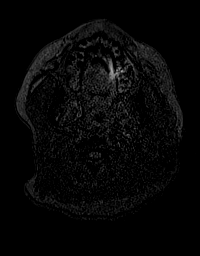
[im 15/160]
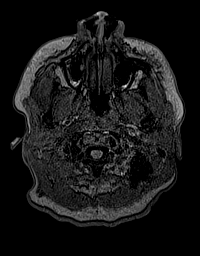
[im 29/160]
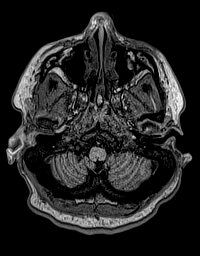
[im 44/160]
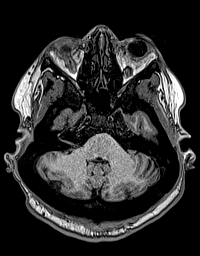
[im 58/160]
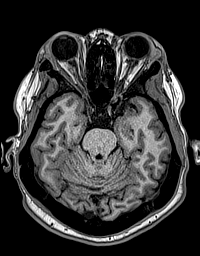
[im 73/160]
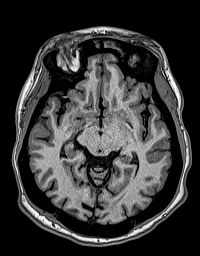
[im 87/160]
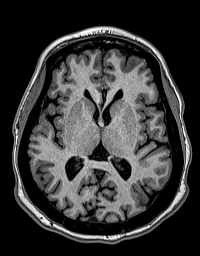
[im 102/160]
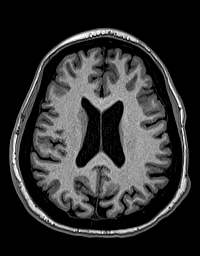
[im 116/160]
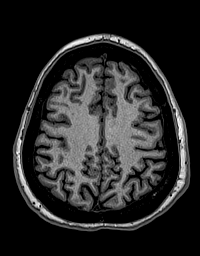
[im 131/160]
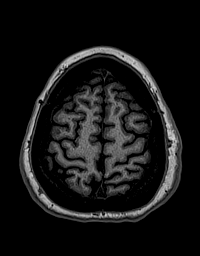
[im 145/160]
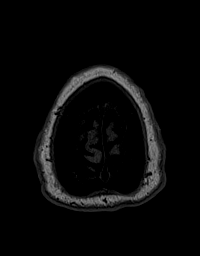
[im 160/160]
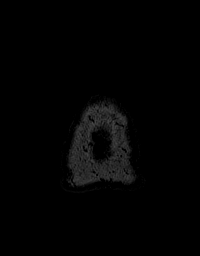

[Series 20: T1 · sagittal · 5.0mm · 0.75mm/px · 2 of 25 slices shown (2 of 2)]
[im 1/25]
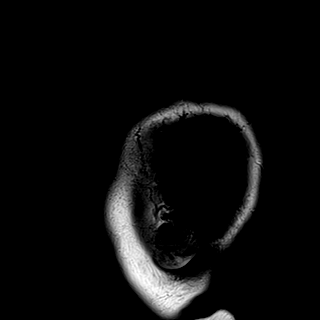
[im 25/25]
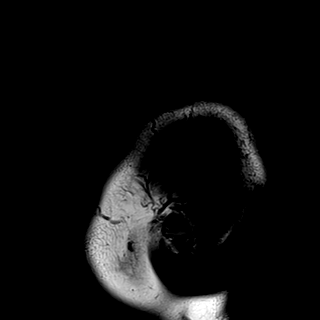

[Series 21: DWI · coronal · 5.0mm · 1.31mm/px · 6 of 76 slices shown (3 of 4)]
[im 1/76]
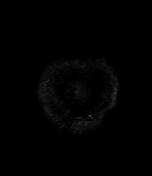
[im 16/76]
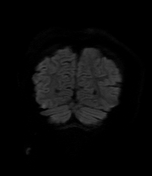
[im 31/76]
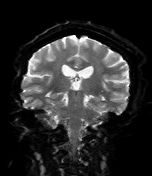
[im 46/76]
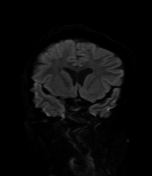
[im 61/76]
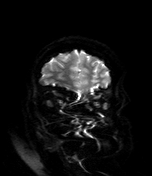
[im 76/76]
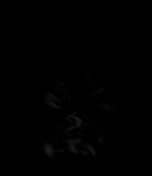

[Series 22: DWI · coronal · 5.0mm · 1.31mm/px · 3 of 38 slices shown (4 of 4)]
[im 1/38]
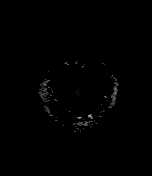
[im 19/38]
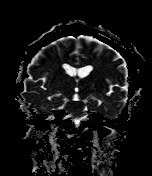
[im 38/38]
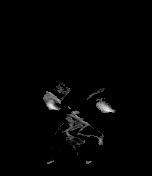

[Series 23: T2 · coronal · 5.0mm · 0.57mm/px · 2 of 29 slices shown (2 of 2)]
[im 1/29]
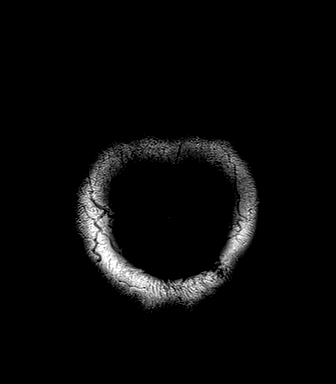
[im 29/29]
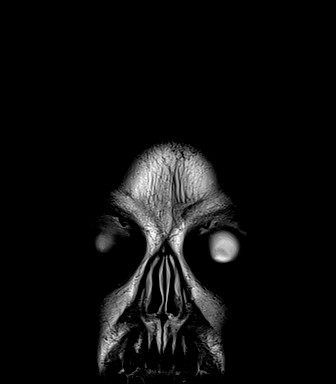

[48 of 48 positions shown; findings below may reference images not displayed]

FINDINGS: MRI HEAD

Brain: There is no acute infarction or intracranial hemorrhage.
There is no intracranial mass, mass effect, hydrocephalus, or
extra-axial fluid collection.

Vascular: Major vessel flow voids at the skull base are preserved.

Skull and upper cervical spine: Normal marrow signal.

Other: Partially empty sella. Probable small left petrous apex
cephalocele. These nonspecific findings could indicate chronically
raised intracranial pressure.

MRI ORBITS

Orbits: Globes are symmetric and unremarkable. Mild distension of
the optic nerve sheath complexes. No imaging evidence of
papilledema. Questionable T2 hyperintensity of the posterior
intraorbital optic nerves. No intraorbital mass.

Visualized paranasal sinuses: Minor mucosal thickening.

Soft tissues: Unremarkable.

MRV HEAD

The superior sagittal sinus, straight sinus, vein of TIGER, internal
cerebral veins, and both transverse and sigmoid sinuses are patent.
There is stenosis at the bilateral transverse-sigmoid sinus
junctions.
IMPRESSION: No mass or abnormal enhancement. No evidence of dural venous sinus
thrombosis.

Constellation of findings suggesting idiopathic intracranial
hypertension.

Questionable abnormal signal of the posterior intraorbital optic
nerves favored to be artifactual.

## 2020-02-09 IMAGING — MR MR ORBITS W/O CM
4 series · 39 of 48 positions shown · non-contrast
Comparison: None.

CLINICAL DATA: Headache, blurry vision

EXAM:
MRI HEAD AND ORBITS WITHOUT CONTRAST
MRV HEAD WITHOUT CONTRAST
TECHNIQUE: Multiplanar, multiecho pulse sequences of the brain and surrounding
structures were obtained without intravenous contrast. Multiplanar,
multiecho pulse sequences of the orbits and surrounding structures
were obtained including fat saturation techniques, without
intravenous contrast administration.
Angiographic images of the intracranial venous structures were
obtained using MRV technique without intravenous contrast.

[Series 24: T2 fat-sat · coronal · 3.0mm · 0.47mm/px · 12 of 32 slices shown (1 of 2)]
[im 1/32]
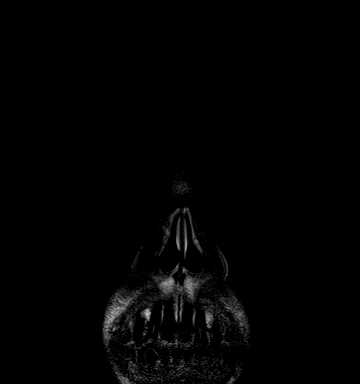
[im 3/32]
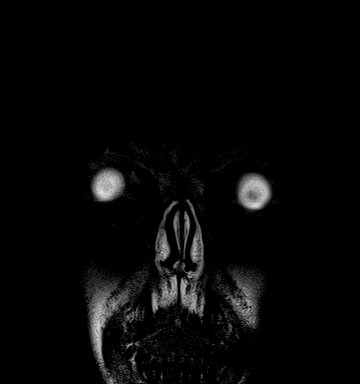
[im 5/32]
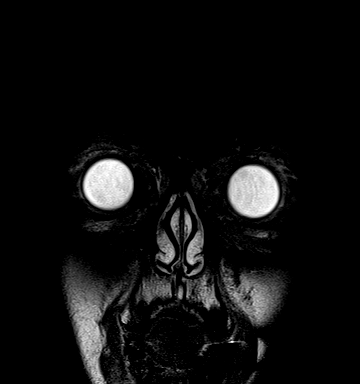
[im 7/32]
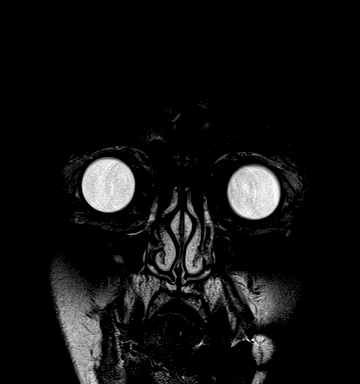
[im 9/32]
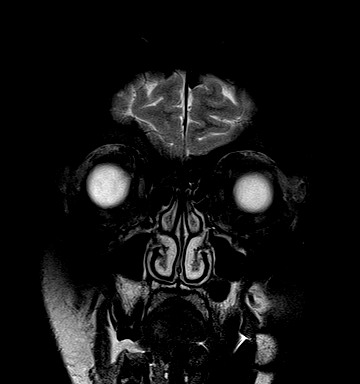
[im 12/32]
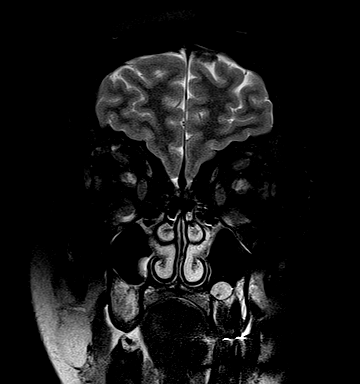
[im 14/32]
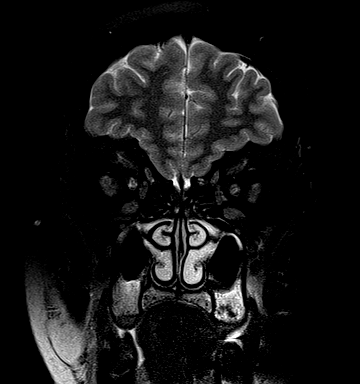
[im 16/32]
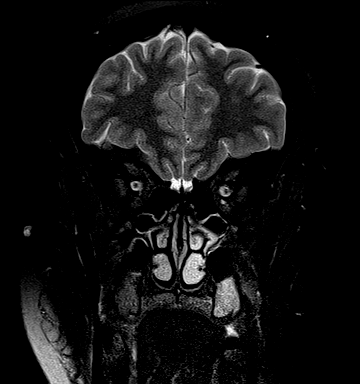
[im 18/32]
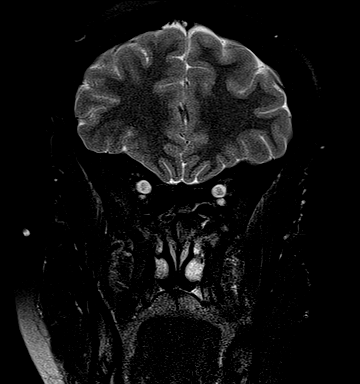
[im 23/32]
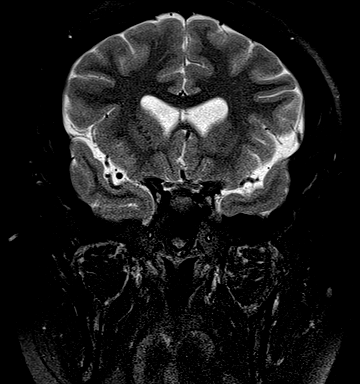
[im 27/32]
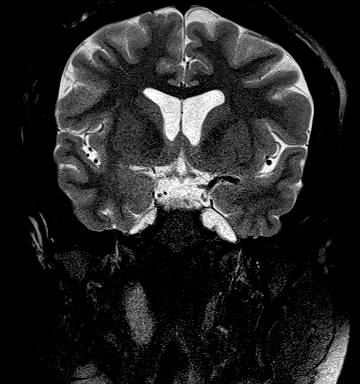
[im 32/32]
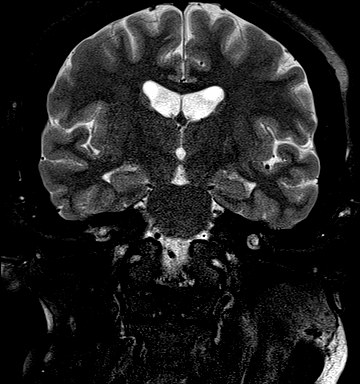

[Series 25: T1 · coronal · 3.0mm · 0.56mm/px · 9 of 32 slices shown (1 of 2)]
[im 1/32]
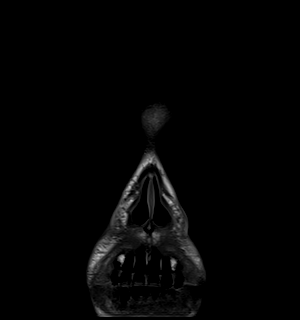
[im 5/32]
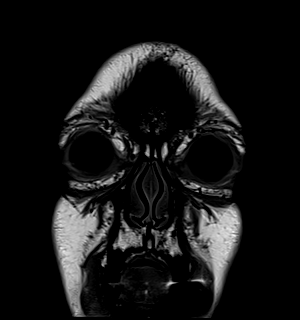
[im 9/32]
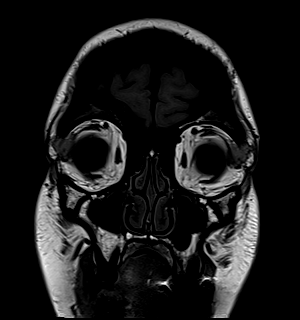
[im 14/32]
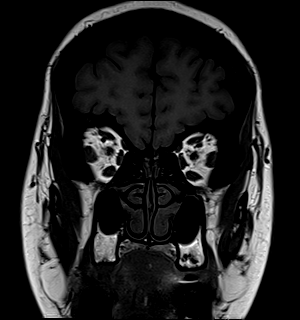
[im 16/32]
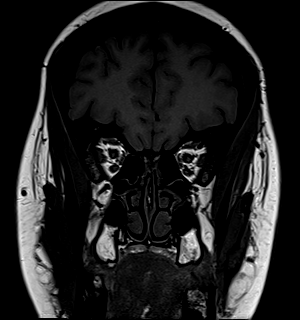
[im 18/32]
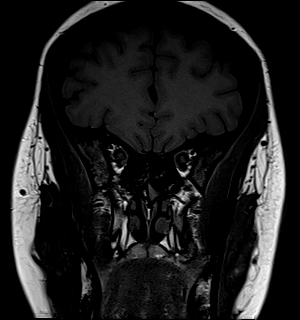
[im 23/32]
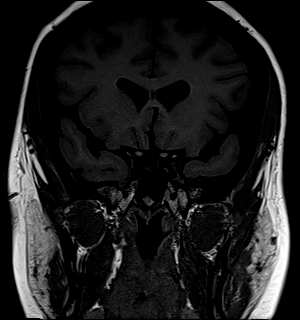
[im 27/32]
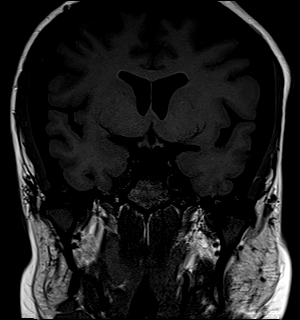
[im 32/32]
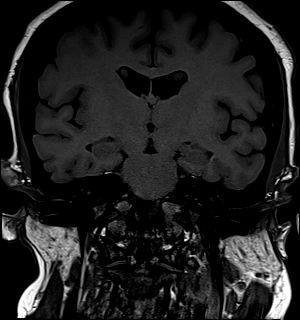

[Series 26: T2 fat-sat · axial · 3.0mm · 0.47mm/px · z∈[-8,+54]mm · 9 of 20 slices shown (2 of 2)]
[im 1/20]
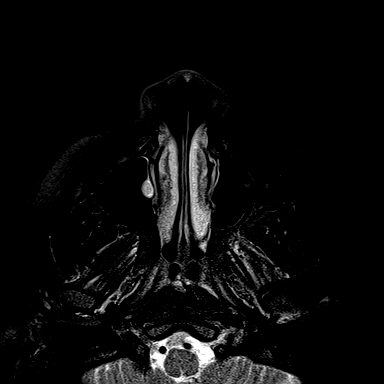
[im 3/20]
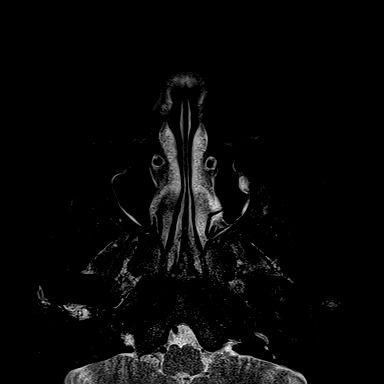
[im 5/20]
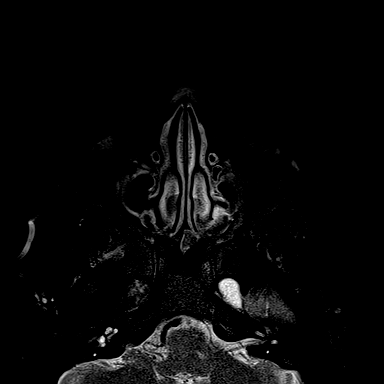
[im 8/20]
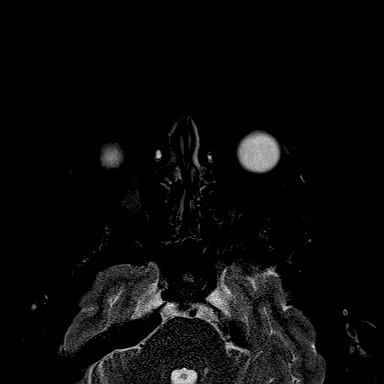
[im 10/20]
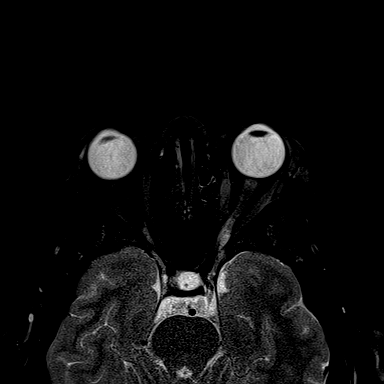
[im 12/20]
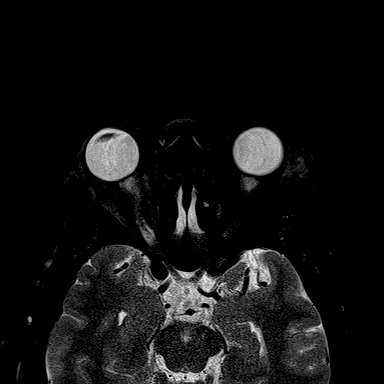
[im 15/20]
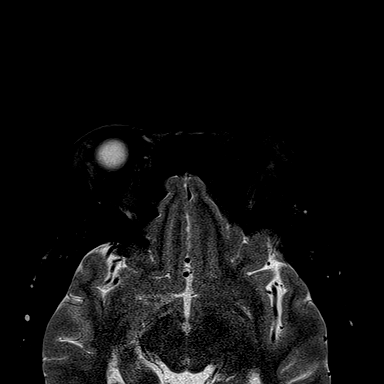
[im 17/20]
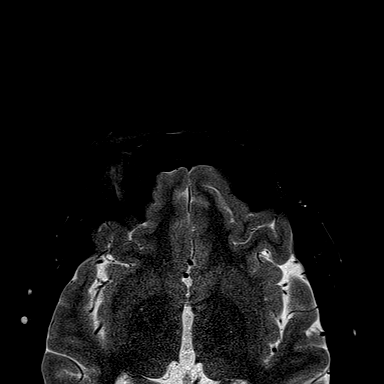
[im 20/20]
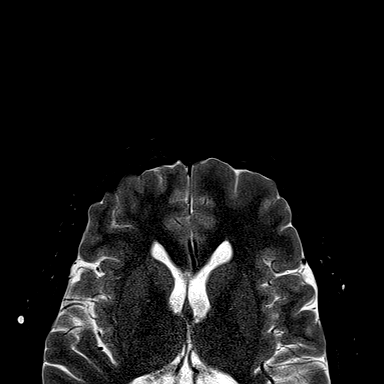

[Series 27: T1 · axial · 3.0mm · 0.56mm/px · z∈[-8,+54]mm · 9 of 20 slices shown (2 of 2)]
[im 1/20]
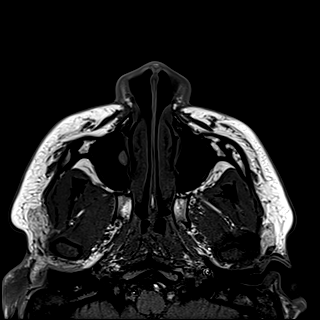
[im 3/20]
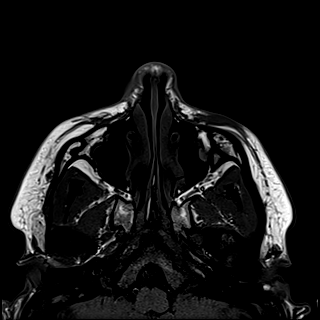
[im 5/20]
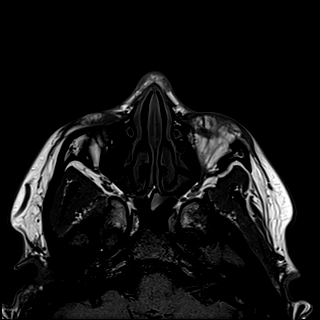
[im 8/20]
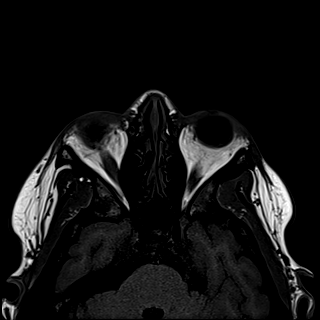
[im 10/20]
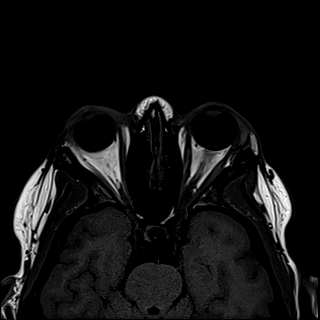
[im 12/20]
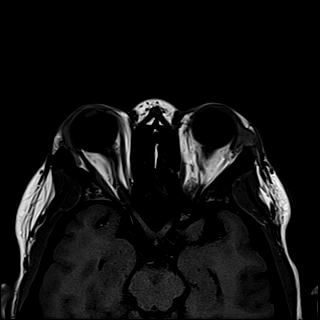
[im 15/20]
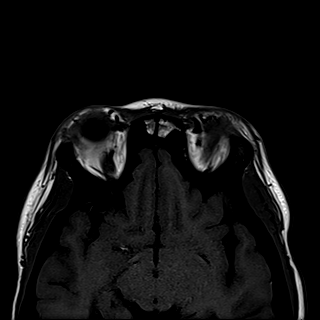
[im 17/20]
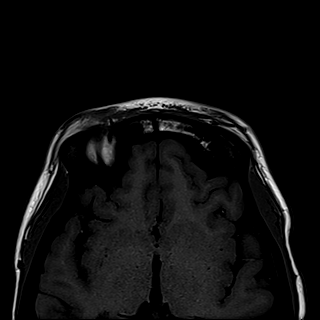
[im 20/20]
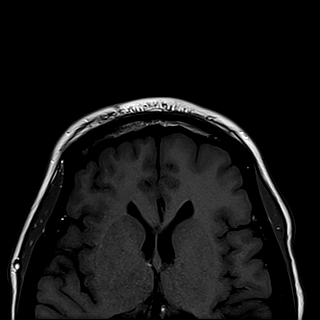

[39 of 48 positions shown; findings below may reference images not displayed]

FINDINGS: MRI HEAD

Brain: There is no acute infarction or intracranial hemorrhage.
There is no intracranial mass, mass effect, hydrocephalus, or
extra-axial fluid collection.

Vascular: Major vessel flow voids at the skull base are preserved.

Skull and upper cervical spine: Normal marrow signal.

Other: Partially empty sella. Probable small left petrous apex
cephalocele. These nonspecific findings could indicate chronically
raised intracranial pressure.

MRI ORBITS

Orbits: Globes are symmetric and unremarkable. Mild distension of
the optic nerve sheath complexes. No imaging evidence of
papilledema. Questionable T2 hyperintensity of the posterior
intraorbital optic nerves. No intraorbital mass.

Visualized paranasal sinuses: Minor mucosal thickening.

Soft tissues: Unremarkable.

MRV HEAD

The superior sagittal sinus, straight sinus, vein of TIGER, internal
cerebral veins, and both transverse and sigmoid sinuses are patent.
There is stenosis at the bilateral transverse-sigmoid sinus
junctions.
IMPRESSION: No mass or abnormal enhancement. No evidence of dural venous sinus
thrombosis.

Constellation of findings suggesting idiopathic intracranial
hypertension.

Questionable abnormal signal of the posterior intraorbital optic
nerves favored to be artifactual.

## 2020-02-09 IMAGING — MR MR MRV HEAD W/O CM
1 of 3 series · 22 of 48 positions shown · non-contrast
Comparison: None.

CLINICAL DATA: Headache, blurry vision

EXAM:
MRI HEAD AND ORBITS WITHOUT CONTRAST
MRV HEAD WITHOUT CONTRAST
TECHNIQUE: Multiplanar, multiecho pulse sequences of the brain and surrounding
structures were obtained without intravenous contrast. Multiplanar,
multiecho pulse sequences of the orbits and surrounding structures
were obtained including fat saturation techniques, without
intravenous contrast administration.
Angiographic images of the intracranial venous structures were
obtained using MRV technique without intravenous contrast.

[Series 28: MRV · coronal · 2.5mm · 0.69mm/px · 22 of 128 slices shown]
[im 1/128]
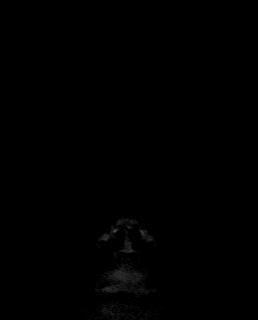
[im 7/128]
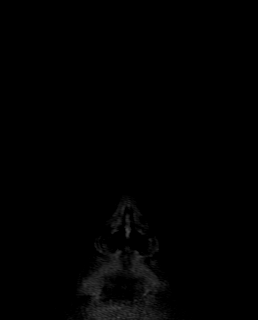
[im 13/128]
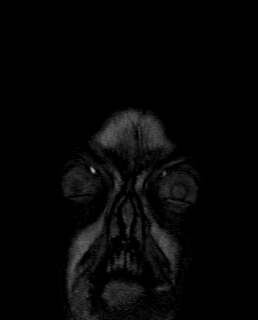
[im 19/128]
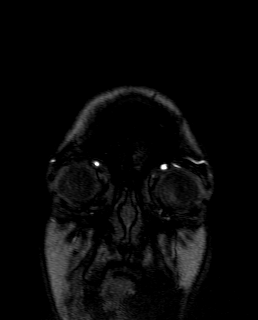
[im 25/128]
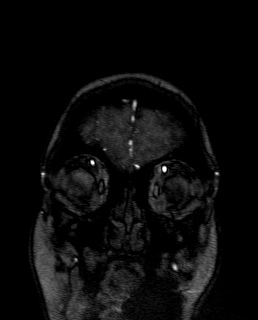
[im 31/128]
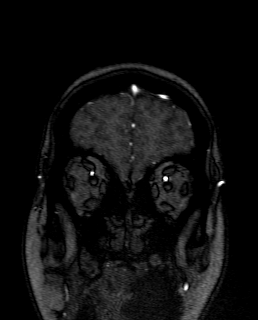
[im 37/128]
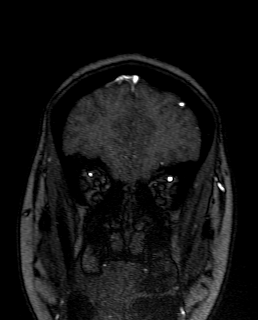
[im 43/128]
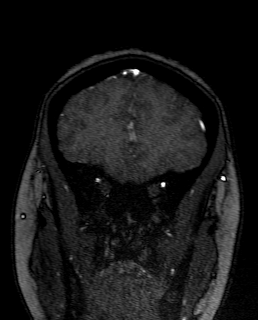
[im 49/128]
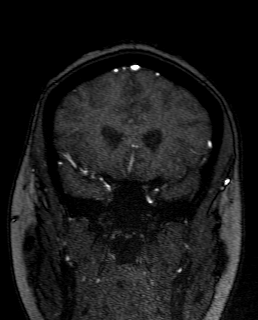
[im 55/128]
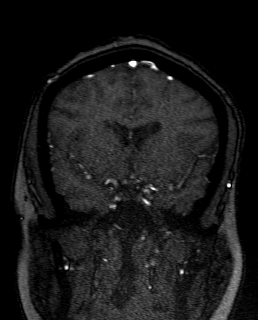
[im 61/128]
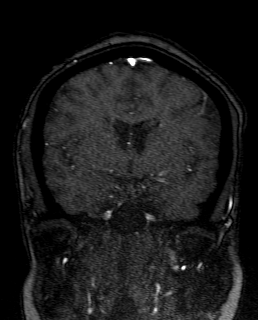
[im 67/128]
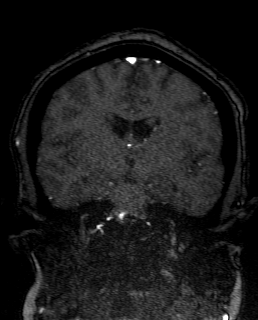
[im 73/128]
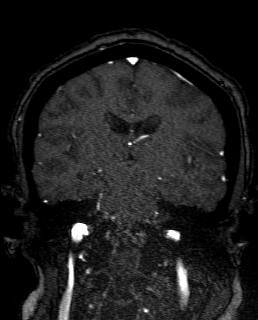
[im 79/128]
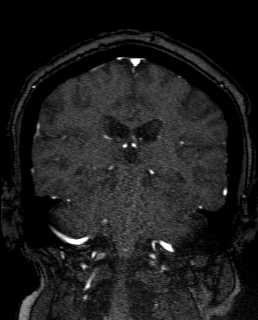
[im 85/128]
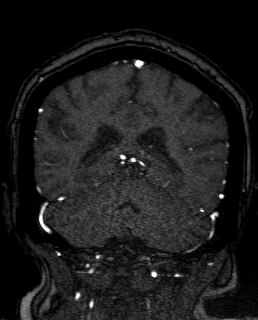
[im 91/128]
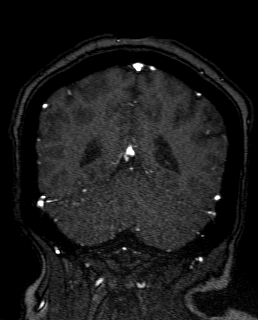
[im 97/128]
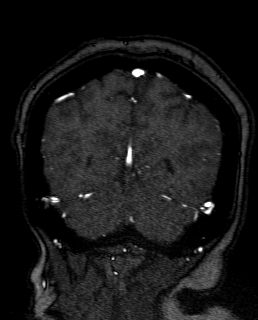
[im 103/128]
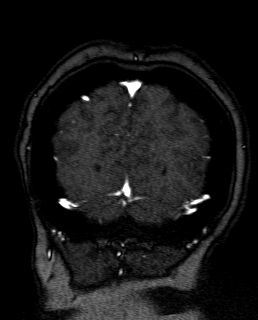
[im 109/128]
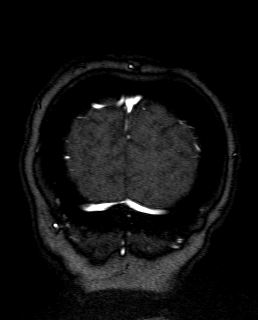
[im 115/128]
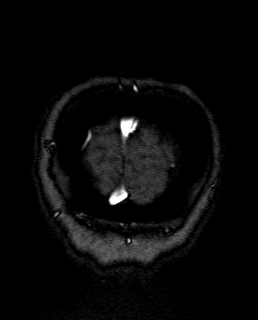
[im 121/128]
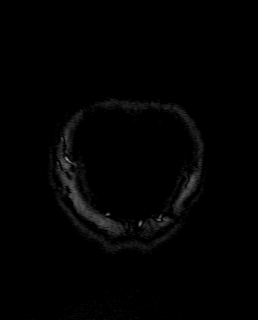
[im 128/128]
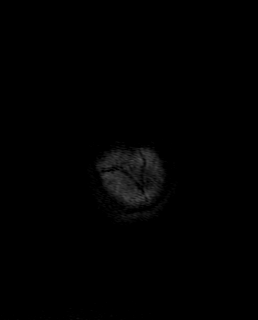

[22 of 48 positions shown; findings below may reference images not displayed]

FINDINGS: MRI HEAD

Brain: There is no acute infarction or intracranial hemorrhage.
There is no intracranial mass, mass effect, hydrocephalus, or
extra-axial fluid collection.

Vascular: Major vessel flow voids at the skull base are preserved.

Skull and upper cervical spine: Normal marrow signal.

Other: Partially empty sella. Probable small left petrous apex
cephalocele. These nonspecific findings could indicate chronically
raised intracranial pressure.

MRI ORBITS

Orbits: Globes are symmetric and unremarkable. Mild distension of
the optic nerve sheath complexes. No imaging evidence of
papilledema. Questionable T2 hyperintensity of the posterior
intraorbital optic nerves. No intraorbital mass.

Visualized paranasal sinuses: Minor mucosal thickening.

Soft tissues: Unremarkable.

MRV HEAD

The superior sagittal sinus, straight sinus, vein of TIGER, internal
cerebral veins, and both transverse and sigmoid sinuses are patent.
There is stenosis at the bilateral transverse-sigmoid sinus
junctions.
IMPRESSION: No mass or abnormal enhancement. No evidence of dural venous sinus
thrombosis.

Constellation of findings suggesting idiopathic intracranial
hypertension.

Questionable abnormal signal of the posterior intraorbital optic
nerves favored to be artifactual.

## 2020-02-09 MED ORDER — SODIUM CHLORIDE 0.9% IV SOLUTION: Freq: Once | INTRAVENOUS | Status: AC

## 2020-02-09 MED ORDER — CLINDAMYCIN HCL 150 MG PO CAPS
150.0000 mg | ORAL_CAPSULE | Freq: Three times a day (TID) | ORAL | Status: DC
  Administered 2020-02-10 (×4): 150 mg via ORAL
  Filled 2020-02-09 (×7): qty 1

## 2020-02-09 MED ORDER — ONDANSETRON HCL 4 MG/2ML IJ SOLN: 4.0000 mg | Freq: Four times a day (QID) | INTRAMUSCULAR | Status: DC | PRN

## 2020-02-09 MED ORDER — ONDANSETRON HCL 4 MG PO TABS: 4.0000 mg | ORAL_TABLET | Freq: Four times a day (QID) | ORAL | Status: DC | PRN

## 2020-02-09 MED ORDER — GADOBUTROL 1 MMOL/ML IV SOLN
10.0000 mL | Freq: Once | INTRAVENOUS | Status: AC | PRN
  Administered 2020-02-09: 19:00:00 10 mL via INTRAVENOUS

## 2020-02-09 NOTE — ED Provider Notes (Signed)
Informed by triage RN that patient is presenting for headache, blurred vision, and eye pressure.  She saw ophthalmology, there is concern for possible IIP, CRVO, or mass.  Ophthalmology request MRI brain and orbits with and without contrast.  Requests MRV.  Will place orders as well as basic labs.  Per notes, patient unable to tolerate MRI for full time, thus contrast was not given.  Triage labs show anemia of 6.5, similar 2 days ago.  Patient's baseline appears to be between 8 and 9. Will add type and screen and anemia panel.     Alicia Apley, PA-C 02/09/20 1950    Terrilee Files, MD 02/10/20 1200

## 2020-02-09 NOTE — ED Triage Notes (Signed)
Patient has been c/o headache, blurred vision, nausea, and pressure behind her eyes x 1 week, but worse in the past 3 days. Patient's mother states that the patient has "swollen optic nerves." and was told to come to the ED for an MRI.

## 2020-02-09 NOTE — Progress Notes (Signed)
Patient terminated the procedure and refused to continue. MRI completed without gadolinium.

## 2020-02-09 NOTE — ED Provider Notes (Signed)
Care assumed from PA Sophia Caccavle please see her note for full details, but in brief Alicia Swanson is a 24 y.o. female who was sent by ophthalmology for further evaluation after she was seen for 2 weeks of headaches, blurred vision and eye pressure and found to have swollen optic disc concerning for potential idiopathic intracranial hypertension, or optic neuritis.  Patient sent with recommendations for MRIs which have been completed and have findings concerning for IIH.  But patient also has a hemoglobin of 6.5, her baseline seems to be around 9, and is related to heavy menstrual bleeding, she is not currently bleeding, but will need blood transfusion.  Given patient's required admission for transfusion felt would be reasonable to have LP done with radiology during hospital stay.  Previous provider discussed with Dr. Toniann Fail with [facility] hospitalist for admission, requested neurology consult to see if patient would need to be transferred to [facility] for more emergent neurology consultation and for recommendations regarding LP and medication management of IIH.  Case discussed with Dr. Amada Jupiter who recommends performing LP, if IR is not available over the weekend it may be pertinent to do LP in the emergency department.  If opening pressure on LP is elevated recommend starting patient on acetazolamide 500 mg twice daily, but does not think that patient needs to be transferred to [facility] or emergent neurologic consult, can have outpatient neurology follow-up.  I discussed this with patient and patient's mother, and they would prefer to have the LP done under fluoroscopy rather than in the ED tonight.  Will speak with radiology to see if this can be done during hospital stay but if not this could be arranged as an outpatient.  Called and spoke with Dr. Earley Favor with radiology who thinks radiology should be able to do the LP tomorrow, request that I go ahead and put in the order for fluoroscopy  guided LP, but have hospitalist care team call radiology in the morning.  I also discussed this with the radiology tech.   Discussed plan with Dr. Toniann Fail as well as with patient and mother who are in agreement.  Dr. Toniann Fail will see and admit the patient.   Legrand Rams 02/09/20 2207    Bethann Berkshire, MD 02/09/20 2244

## 2020-02-09 NOTE — ED Provider Notes (Signed)
[facility] DEPT Provider Note   CSN: [id number] Arrival date & time: 02/09/20  1602     History Chief Complaint  Patient presents with   Headache   Nausea    Alicia Swanson is a 24 y.o. female presenting for evaluation of blurred vision, headache and eye pressure.  Patient states for the past 2 weeks, she has been having headache, blurred vision, and eye pressure.  She reports associated nausea.  Symptoms have worsened the past couple days.  She saw ophthalmology, who is concerned for optic neuritis.  Outpatient scan was ordered, however due to patient's her symptoms and possible days, recommended she come to the ER.  She denies headache currently.  She denies fevers, chills, neck pain, chest pain, shortness of breath, nausea, vomiting, dumping, urinary symptoms, abnormal bowel movements.  She denies hematemesis, melena, or hematochezia.  She is not on blood thinners.  Patient states her last period was 2 weeks ago, it was heavier than normal, but ended 1 week ago.  She has never needed a blood transfusion in the past.  Additional history obtained from mom. Mom states pt looks more pale than normal.   Additional history obtained from chart review.  Patient with a history of seizures as a child, OSA, obesity, mild intellectual disability, lymphedema, menorrhagia. Per chart review, baseline hgb 8-9.    HPI     Past Medical History:  Diagnosis Date   Asthma    Headache(784.0)    Learning difficulty involving reading    OSA (obstructive sleep apnea)    Seizures ([place])     Patient Active Problem List   Diagnosis Date Noted   Obesity, unspecified 02/14/2014   Acquired acanthosis nigricans 02/14/2014   Optic atrophy, unspecified 02/05/2014   Mild intellectual disabilities 02/05/2014   Morbid obesity ([place]) 02/05/2014   LYMPHEDEMA 04/29/2009   MENORRHAGIA 04/29/2009   DENTAL PAIN 11/15/2007   DEVELOPMENTAL DELAY 06/20/2007    Obstructive sleep apnea 06/20/2007   OSTEOARTHRITIS 06/20/2007   SEIZURE DISORDER 06/20/2007   Edema 06/20/2007    Past Surgical History:  Procedure Laterality Date   ADENOIDECTOMY     EAR TUBE REMOVAL Bilateral 2001   HERNIA REPAIR     TONSILLECTOMY Bilateral 2001   TYMPANOSTOMY TUBE PLACEMENT Bilateral 1998     OB History   No obstetric history on file.     Family History  Problem Relation Age of Onset   Migraines Mother    Seizures Maternal Aunt    Hypertension Maternal Grandmother    Stroke Maternal Grandfather    Asthma Brother    Asthma Sister     Social History   Tobacco Use   Smoking status: Never Smoker   Smokeless tobacco: Never Used  Scientific laboratory technician Use: Never used  Substance Use Topics   Alcohol use: No   Drug use: No    Home Medications Prior to Admission medications   Medication Sig Start Date End Date Taking? Authorizing Provider  ibuprofen (ADVIL,MOTRIN) 200 MG tablet Take 400 mg by mouth every 6 (six) hours as needed for headache, mild pain or moderate pain.    [provider]  sertraline (ZOLOFT) 100 MG tablet TAKE 1 TABLET(S) BY MOUTH AT BEDTIME FOR ANXIETY 05/19/16   [provider]  traZODone (DESYREL) 100 MG tablet TAKE 2 TABLET(S) BY MOUTH AT BEDTIME FOR INSOMNIA 05/19/16   [provider]    Allergies    Penicillins  Review of Systems  Review of Systems  Eyes: Positive for visual disturbance.  Neurological: Positive for headaches (none currently).  All other systems reviewed and are negative.   Physical Exam Updated Vital Signs BP 125/73 (BP Location: Left Arm)    Pulse 92    Temp 98.9 F (37.2 C)    Resp 16    Ht 5\' 7"  (1.702 m)    Wt 106.6 kg    LMP 01/30/2020    SpO2 100%    BMI 36.81 kg/m   Physical Exam Vitals and nursing note reviewed.  Constitutional:      General: She is not in acute distress.    Appearance: She is well-developed.     Comments: Resting in the bed in  NAD  HENT:     Head: Normocephalic and atraumatic.  Eyes:     Extraocular Movements: Extraocular movements intact.     Pupils: Pupils are equal, round, and reactive to light.     Comments: Slight conjunctival pallor  Cardiovascular:     Rate and Rhythm: Normal rate and regular rhythm.     Pulses: Normal pulses.  Pulmonary:     Effort: Pulmonary effort is normal. No respiratory distress.     Breath sounds: Normal breath sounds. No wheezing.  Abdominal:     General: There is no distension.     Palpations: Abdomen is soft. There is no mass.     Tenderness: There is no abdominal tenderness. There is no guarding or rebound.  Musculoskeletal:        General: Normal range of motion.     Cervical back: Normal range of motion and neck supple.     Right lower leg: Edema present.     Left lower leg: Edema present.     Comments: Symmetric leg swelling, mom states is baseline due to lymphedema. no ttp  Skin:    General: Skin is warm and dry.     Capillary Refill: Capillary refill takes less than 2 seconds.  Neurological:     Mental Status: She is alert and oriented to person, place, and time.     ED Results / Procedures / Treatments   Labs (all labs ordered are listed, but only abnormal results are displayed) Labs Reviewed  CBC WITH DIFFERENTIAL/PLATELET - Abnormal; Notable for the following components:      Result Value   Hemoglobin 6.5 (*)    HCT 26.2 (*)    MCV 66.7 (*)    MCH 16.5 (*)    MCHC 24.8 (*)    RDW 21.0 (*)    nRBC 0.9 (*)    All other components within normal limits  COMPREHENSIVE METABOLIC PANEL - Abnormal; Notable for the following components:   Calcium 8.6 (*)    All other components within normal limits  VITAMIN B12  FOLATE  IRON AND TIBC  FERRITIN  RETICULOCYTES  I-STAT BETA HCG BLOOD, ED ([facility], [facility], [facility] ONLY)  TYPE AND SCREEN  PREPARE RBC (CROSSMATCH)    EKG None  Radiology MR BRAIN WO CONTRAST  Result Date: 02/09/2020 CLINICAL DATA:  Headache,  blurry vision EXAM: MRI HEAD AND ORBITS WITHOUT CONTRAST MRV HEAD WITHOUT CONTRAST TECHNIQUE: Multiplanar, multiecho pulse sequences of the brain and surrounding structures were obtained without intravenous contrast. Multiplanar, multiecho pulse sequences of the orbits and surrounding structures were obtained including fat saturation techniques, without intravenous contrast administration. Angiographic images of the intracranial venous structures were obtained using MRV technique without intravenous contrast. COMPARISON:  None. FINDINGS: MRI HEAD Brain: There  is no acute infarction or intracranial hemorrhage. There is no intracranial mass, mass effect, hydrocephalus, or extra-axial fluid collection. Vascular: Major vessel flow voids at the skull base are preserved. Skull and upper cervical spine: Normal marrow signal. Other: Partially empty sella. Probable small left petrous apex cephalocele. These nonspecific findings could indicate chronically raised intracranial pressure. MRI ORBITS Orbits: Globes are symmetric and unremarkable. Mild distension of the optic nerve sheath complexes. No imaging evidence of papilledema. Questionable T2 hyperintensity of the posterior intraorbital optic nerves. No intraorbital mass. Visualized paranasal sinuses: Minor mucosal thickening. Soft tissues: Unremarkable. MRV HEAD The superior sagittal sinus, straight sinus, vein of Galen, internal cerebral veins, and both transverse and sigmoid sinuses are patent. There is stenosis at the bilateral transverse-sigmoid sinus junctions. IMPRESSION: No mass or abnormal enhancement. No evidence of dural venous sinus thrombosis. Constellation of findings suggesting idiopathic intracranial hypertension. Questionable abnormal signal of the posterior intraorbital optic nerves favored to be artifactual. Electronically Signed   By: Guadlupe Spanish M.D.   On: 02/09/2020 19:08   MR MRV HEAD WO CM  Result Date: 02/09/2020 CLINICAL DATA:  Headache,  blurry vision EXAM: MRI HEAD AND ORBITS WITHOUT CONTRAST MRV HEAD WITHOUT CONTRAST TECHNIQUE: Multiplanar, multiecho pulse sequences of the brain and surrounding structures were obtained without intravenous contrast. Multiplanar, multiecho pulse sequences of the orbits and surrounding structures were obtained including fat saturation techniques, without intravenous contrast administration. Angiographic images of the intracranial venous structures were obtained using MRV technique without intravenous contrast. COMPARISON:  None. FINDINGS: MRI HEAD Brain: There is no acute infarction or intracranial hemorrhage. There is no intracranial mass, mass effect, hydrocephalus, or extra-axial fluid collection. Vascular: Major vessel flow voids at the skull base are preserved. Skull and upper cervical spine: Normal marrow signal. Other: Partially empty sella. Probable small left petrous apex cephalocele. These nonspecific findings could indicate chronically raised intracranial pressure. MRI ORBITS Orbits: Globes are symmetric and unremarkable. Mild distension of the optic nerve sheath complexes. No imaging evidence of papilledema. Questionable T2 hyperintensity of the posterior intraorbital optic nerves. No intraorbital mass. Visualized paranasal sinuses: Minor mucosal thickening. Soft tissues: Unremarkable. MRV HEAD The superior sagittal sinus, straight sinus, vein of Galen, internal cerebral veins, and both transverse and sigmoid sinuses are patent. There is stenosis at the bilateral transverse-sigmoid sinus junctions. IMPRESSION: No mass or abnormal enhancement. No evidence of dural venous sinus thrombosis. Constellation of findings suggesting idiopathic intracranial hypertension. Questionable abnormal signal of the posterior intraorbital optic nerves favored to be artifactual. Electronically Signed   By: Guadlupe Spanish M.D.   On: 02/09/2020 19:08   MR ORBITS WO CONTRAST  Result Date: 02/09/2020 CLINICAL DATA:  Headache,  blurry vision EXAM: MRI HEAD AND ORBITS WITHOUT CONTRAST MRV HEAD WITHOUT CONTRAST TECHNIQUE: Multiplanar, multiecho pulse sequences of the brain and surrounding structures were obtained without intravenous contrast. Multiplanar, multiecho pulse sequences of the orbits and surrounding structures were obtained including fat saturation techniques, without intravenous contrast administration. Angiographic images of the intracranial venous structures were obtained using MRV technique without intravenous contrast. COMPARISON:  None. FINDINGS: MRI HEAD Brain: There is no acute infarction or intracranial hemorrhage. There is no intracranial mass, mass effect, hydrocephalus, or extra-axial fluid collection. Vascular: Major vessel flow voids at the skull base are preserved. Skull and upper cervical spine: Normal marrow signal. Other: Partially empty sella. Probable small left petrous apex cephalocele. These nonspecific findings could indicate chronically raised intracranial pressure. MRI ORBITS Orbits: Globes are symmetric and unremarkable. Mild distension of the optic nerve  sheath complexes. No imaging evidence of papilledema. Questionable T2 hyperintensity of the posterior intraorbital optic nerves. No intraorbital mass. Visualized paranasal sinuses: Minor mucosal thickening. Soft tissues: Unremarkable. MRV HEAD The superior sagittal sinus, straight sinus, vein of Galen, internal cerebral veins, and both transverse and sigmoid sinuses are patent. There is stenosis at the bilateral transverse-sigmoid sinus junctions. IMPRESSION: No mass or abnormal enhancement. No evidence of dural venous sinus thrombosis. Constellation of findings suggesting idiopathic intracranial hypertension. Questionable abnormal signal of the posterior intraorbital optic nerves favored to be artifactual. Electronically Signed   By: Guadlupe Spanish M.D.   On: 02/09/2020 19:08    Procedures .Critical Care Performed by: Alveria Apley,  PA-C Authorized by: Alveria Apley, PA-C   Critical care provider statement:    Critical care time (minutes):  35   Critical care time was exclusive of:  Separately billable procedures and treating other patients and teaching time   Critical care was necessary to treat or prevent imminent or life-threatening deterioration of the following conditions:  Circulatory failure   Critical care was time spent personally by me on the following activities:  Blood draw for specimens, development of treatment plan with patient or surrogate, examination of patient, obtaining history from patient or surrogate, ordering and performing treatments and interventions, ordering and review of radiographic studies, pulse oximetry, ordering and review of laboratory studies, re-evaluation of patient's condition, review of old charts and evaluation of patient's response to treatment   I assumed direction of critical care for this patient from another provider in my specialty: no   Comments:     Pt with critically low hgb at 6.5. will need transfusion and admission   (including critical care time)  Medications Ordered in ED Medications  0.9 %  sodium chloride infusion (Manually program via Guardrails IV Fluids) (has no administration in time range)  gadobutrol (GADAVIST) 1 MMOL/ML injection 10 mL (10 mLs Intravenous Contrast Given 02/09/20 1850)    ED Course  I have reviewed the triage vital signs and the nursing notes.  Pertinent labs & imaging results that were available during my care of the patient were reviewed by me and considered in my medical decision making (see chart for details).    MDM Rules/Calculators/A&P                          Patient presenting for evaluation of headache, blurry vision, and eye pressure.  On exam, patient appears nontoxic.  She does have slight conjunctival pallor.  Neuro exam performed earlier today by ophthalmologist, reviewed findings.  Concern for optic neuritis.  MRI is  ordered per ophthalmology request.  Unfortunately, patient unable to tolerate MRI for longer time, thus no contrast was given.  Labs obtained from triage show a critically low hemoglobin at 6.5.  This is of patient's baseline.  Likely due to heavy vaginal bleeding 2 weeks ago.  In the setting of headaches and pallor according to mom, will start blood transfusion and admit to hospital.   MRI consistent with IIP.  Patient without headache currently, no papilledema on MRI.  If needed for further evaluation or symptom control, consider IR LP during hospitalization, however I do not believe it needs to be performed emergently in the ED.  Discussed findings and plan with patient and mom, who are agreeable. Case discussed with attending, Dr. Charm Barges evaluated the pt.   Discussed with Dr. Toniann Fail from [facility] hospitalist service. Requesting neuro consultation prior to admission.  Pt signed out to K ford, PA-C for f/u on neuro rec's.  Final Clinical Impression(s) / ED Diagnoses Final diagnoses:  Anemia, unspecified type  Idiopathic intracranial hypertension    Rx / DC Orders ED Discharge Orders    None       Alveria ApleyCaccavale, Daryl Beehler, PA-C 02/09/20 2054    Terrilee FilesButler, Michael C, MD 02/10/20 1201

## 2020-02-09 NOTE — H&P (Signed)
History and Physical    Alicia Swanson EPP:[id number] DOB: [birth year] DOA: 02/09/2020  PCP: Kristie Cowman, MD  Patient coming from: Home.  History obtained from patient and patient's mother.  Chief Complaint: Headache blurred vision.  HPI: Alicia Swanson is a 24 y.o. female with history of sleep apnea morbid obesity previous history of seizures presently not on any medication for the seizures for the last 20 years has been experiencing frontal headaches for the last 2 months and blurred vision for the last 2 weeks.  Had gone to the ophthalmologist for this on February 06, 2020 and ophthalmologist was concerned about idiopathic intracranial hypertension and was planning to do MRI of the brain as outpatient since patient symptoms got worse with patient having nausea vomiting today patient presents to the ER.  Denies any weakness of upper or lower extremities.  Recently patient was placed on clindamycin about a week ago for tooth abscess and has plans to have a tooth extracted next week.  Symptoms starting before starting clindamycin.  Patient also has been having heavy menstrual cycle off and on the last one was 2 weeks ago when she had heavy periods.  Not actively bleeding at this time.  ED Course: In the ER patient appeared nonfocal.  Patient underwent MRI of the brain and orbits and MRV.  Showed features concerning for idiopathic intracranial hypertension.  The ER physician discussed with Dr. Leonel Ramsay on-call neurologist who advised getting lumbar puncture and if opening pressure is high to start patient on acetazolamide 500 twice daily.  In addition patient's labs are remarkable for hemoglobin of 6.5 with ferritin of two for which 2 units of PRBC transfusion has been ordered.  Covid test is pending.  EKG shows normal sinus rhythm.  Review of Systems: As per HPI, rest all negative.   Past Medical History:  Diagnosis Date  . Asthma   . Headache(784.0)   . Learning difficulty  involving reading   . OSA (obstructive sleep apnea)   . Seizures ([place])     Past Surgical History:  Procedure Laterality Date  . ADENOIDECTOMY    . EAR TUBE REMOVAL Bilateral 2001  . HERNIA REPAIR    . TONSILLECTOMY Bilateral 2001  . TYMPANOSTOMY TUBE PLACEMENT Bilateral 1998     reports that she has never smoked. She has never used smokeless tobacco. She reports that she does not drink alcohol and does not use drugs.  Allergies -penicillin.  Family History  Problem Relation Age of Onset  . Migraines Mother   . Seizures Maternal Aunt   . Hypertension Maternal Grandmother   . Stroke Maternal Grandfather   . Asthma Brother   . Asthma Sister     Prior to Admission medications   Medication Sig Start Date End Date Taking? Authorizing Provider  acetaminophen (TYLENOL) 325 MG tablet Take 975 mg by mouth every 6 (six) hours as needed for moderate pain.   Yes [provider]  clindamycin (CLEOCIN) 150 MG capsule Take 150 mg by mouth 3 (three) times daily. 02/01/20  Yes [provider]    Physical Exam: Constitutional: Moderately built and nourished. Vitals:   02/09/20 1614 02/09/20 1618 02/09/20 2100 02/09/20 2323  BP: 125/73  113/65 121/79  Pulse:   79 81  Resp:   12 18  Temp:      SpO2:   99% 99%  Weight:  106.6 kg    Height:  5\' 7"  (1.702 m)     Eyes: Anicteric no pallor. ENMT:  No discharge from the ears eyes nose or mouth. Neck: No mass felt.  No neck rigidity. Respiratory: No rhonchi or crepitations. Cardiovascular: S1-S2 heard. Abdomen: Soft nontender bowel sounds present. Musculoskeletal: No edema. Skin: No rash. Neurologic: Alert awake oriented time place and person.  Moves all extremities. Psychiatric: Appears normal.  Normal affect.   Labs on Admission: I have personally reviewed following labs and imaging studies  CBC: Recent Labs  Lab 02/07/20 1620 02/09/20 1657  WBC 7.7 5.7  NEUTROABS  --  2.2  HGB 6.6* 6.5*  HCT 26.3* 26.2*    MCV 66.1* 66.7*  PLT 279 369   Basic Metabolic Panel: Recent Labs  Lab 02/07/20 1620 02/09/20 1657  NA 139 142  K 3.2* 3.7  CL 102 106  CO2 28 26  GLUCOSE 112* 82  BUN 6 6  CREATININE 0.53 0.45  CALCIUM 8.5* 8.6*   GFR: Estimated Creatinine Clearance: 136.3 mL/min (by C-G formula based on SCr of 0.45 mg/dL). Liver Function Tests: Recent Labs  Lab 02/09/20 1657  AST 18  ALT 11  ALKPHOS 59  BILITOT 0.5  PROT 6.9  ALBUMIN 3.5   No results for input(s): LIPASE, AMYLASE in the last 168 hours. No results for input(s): AMMONIA in the last 168 hours. Coagulation Profile: No results for input(s): INR, PROTIME in the last 168 hours. Cardiac Enzymes: No results for input(s): CKTOTAL, CKMB, CKMBINDEX, TROPONINI in the last 168 hours. BNP (last 3 results) No results for input(s): PROBNP in the last 8760 hours. HbA1C: No results for input(s): HGBA1C in the last 72 hours. CBG: No results for input(s): GLUCAP in the last 168 hours. Lipid Profile: No results for input(s): CHOL, HDL, LDLCALC, TRIG, CHOLHDL, LDLDIRECT in the last 72 hours. Thyroid Function Tests: No results for input(s): TSH, T4TOTAL, FREET4, T3FREE, THYROIDAB in the last 72 hours. Anemia Panel: Recent Labs    02/09/20 2316  RETICCTPCT 1.6   Urine analysis:    Component Value Date/Time   COLORURINE yellow 06/22/2007 1024   APPEARANCEUR Clear 06/22/2007 1024   LABSPEC 1.020 06/22/2007 1024   PHURINE 7.0 06/22/2007 1024   HGBUR negative 06/22/2007 1024   BILIRUBINUR negative 06/22/2007 1024   UROBILINOGEN 0.2 06/22/2007 1024   NITRITE negative 06/22/2007 1024   Sepsis Labs: @LABRCNTIP (procalcitonin:4,lacticidven:4) )No results found for this or any previous visit (from the past 240 hour(s)).   Radiological Exams on Admission: MR BRAIN WO CONTRAST  Result Date: 02/09/2020 CLINICAL DATA:  Headache, blurry vision EXAM: MRI HEAD AND ORBITS WITHOUT CONTRAST MRV HEAD WITHOUT CONTRAST TECHNIQUE:  Multiplanar, multiecho pulse sequences of the brain and surrounding structures were obtained without intravenous contrast. Multiplanar, multiecho pulse sequences of the orbits and surrounding structures were obtained including fat saturation techniques, without intravenous contrast administration. Angiographic images of the intracranial venous structures were obtained using MRV technique without intravenous contrast. COMPARISON:  None. FINDINGS: MRI HEAD Brain: There is no acute infarction or intracranial hemorrhage. There is no intracranial mass, mass effect, hydrocephalus, or extra-axial fluid collection. Vascular: Major vessel flow voids at the skull base are preserved. Skull and upper cervical spine: Normal marrow signal. Other: Partially empty sella. Probable small left petrous apex cephalocele. These nonspecific findings could indicate chronically raised intracranial pressure. MRI ORBITS Orbits: Globes are symmetric and unremarkable. Mild distension of the optic nerve sheath complexes. No imaging evidence of papilledema. Questionable T2 hyperintensity of the posterior intraorbital optic nerves. No intraorbital mass. Visualized paranasal sinuses: Minor mucosal thickening. Soft tissues: Unremarkable. MRV HEAD The superior  sagittal sinus, straight sinus, vein of Galen, internal cerebral veins, and both transverse and sigmoid sinuses are patent. There is stenosis at the bilateral transverse-sigmoid sinus junctions. IMPRESSION: No mass or abnormal enhancement. No evidence of dural venous sinus thrombosis. Constellation of findings suggesting idiopathic intracranial hypertension. Questionable abnormal signal of the posterior intraorbital optic nerves favored to be artifactual. Electronically Signed   By: Guadlupe Spanish M.D.   On: 02/09/2020 19:08   MR MRV HEAD WO CM  Result Date: 02/09/2020 CLINICAL DATA:  Headache, blurry vision EXAM: MRI HEAD AND ORBITS WITHOUT CONTRAST MRV HEAD WITHOUT CONTRAST TECHNIQUE:  Multiplanar, multiecho pulse sequences of the brain and surrounding structures were obtained without intravenous contrast. Multiplanar, multiecho pulse sequences of the orbits and surrounding structures were obtained including fat saturation techniques, without intravenous contrast administration. Angiographic images of the intracranial venous structures were obtained using MRV technique without intravenous contrast. COMPARISON:  None. FINDINGS: MRI HEAD Brain: There is no acute infarction or intracranial hemorrhage. There is no intracranial mass, mass effect, hydrocephalus, or extra-axial fluid collection. Vascular: Major vessel flow voids at the skull base are preserved. Skull and upper cervical spine: Normal marrow signal. Other: Partially empty sella. Probable small left petrous apex cephalocele. These nonspecific findings could indicate chronically raised intracranial pressure. MRI ORBITS Orbits: Globes are symmetric and unremarkable. Mild distension of the optic nerve sheath complexes. No imaging evidence of papilledema. Questionable T2 hyperintensity of the posterior intraorbital optic nerves. No intraorbital mass. Visualized paranasal sinuses: Minor mucosal thickening. Soft tissues: Unremarkable. MRV HEAD The superior sagittal sinus, straight sinus, vein of Galen, internal cerebral veins, and both transverse and sigmoid sinuses are patent. There is stenosis at the bilateral transverse-sigmoid sinus junctions. IMPRESSION: No mass or abnormal enhancement. No evidence of dural venous sinus thrombosis. Constellation of findings suggesting idiopathic intracranial hypertension. Questionable abnormal signal of the posterior intraorbital optic nerves favored to be artifactual. Electronically Signed   By: Guadlupe Spanish M.D.   On: 02/09/2020 19:08   MR ORBITS WO CONTRAST  Result Date: 02/09/2020 CLINICAL DATA:  Headache, blurry vision EXAM: MRI HEAD AND ORBITS WITHOUT CONTRAST MRV HEAD WITHOUT CONTRAST TECHNIQUE:  Multiplanar, multiecho pulse sequences of the brain and surrounding structures were obtained without intravenous contrast. Multiplanar, multiecho pulse sequences of the orbits and surrounding structures were obtained including fat saturation techniques, without intravenous contrast administration. Angiographic images of the intracranial venous structures were obtained using MRV technique without intravenous contrast. COMPARISON:  None. FINDINGS: MRI HEAD Brain: There is no acute infarction or intracranial hemorrhage. There is no intracranial mass, mass effect, hydrocephalus, or extra-axial fluid collection. Vascular: Major vessel flow voids at the skull base are preserved. Skull and upper cervical spine: Normal marrow signal. Other: Partially empty sella. Probable small left petrous apex cephalocele. These nonspecific findings could indicate chronically raised intracranial pressure. MRI ORBITS Orbits: Globes are symmetric and unremarkable. Mild distension of the optic nerve sheath complexes. No imaging evidence of papilledema. Questionable T2 hyperintensity of the posterior intraorbital optic nerves. No intraorbital mass. Visualized paranasal sinuses: Minor mucosal thickening. Soft tissues: Unremarkable. MRV HEAD The superior sagittal sinus, straight sinus, vein of Galen, internal cerebral veins, and both transverse and sigmoid sinuses are patent. There is stenosis at the bilateral transverse-sigmoid sinus junctions. IMPRESSION: No mass or abnormal enhancement. No evidence of dural venous sinus thrombosis. Constellation of findings suggesting idiopathic intracranial hypertension. Questionable abnormal signal of the posterior intraorbital optic nerves favored to be artifactual. Electronically Signed   By: Jackquline Berlin.D.  On: 02/09/2020 19:08    EKG: Independently reviewed.  Normal sinus rhythm.  Assessment/Plan Principal Problem:   Idiopathic intracranial hypertension Active Problems:   MENORRHAGIA    Mild intellectual disabilities   Anemia   Intracranial hypertension    1. Idiopathic intracranial hypertension -per neurology Dr. Amada Jupiter to get lumbar puncture and if opening pressure is elevated will need to be started on acetazolamide 500 mg p.o. twice daily and follow-up with neurologist.  Lumbar puncture has been in range with radiology for fluoroscopic guidance. 2. Iron deficiency anemia secondary to menorrhagia for which 2 units of PRBC transfusion has been ordered.  Patient will need to be on iron supplements.  Advised to follow-up with OB/GYN primary care for menorrhagia.  Check TSH. 3. Sleep apnea on CPAP at bedtime. 4. Previous history of seizures presently not on any medications. 5. Morbid obesity advised about weight loss and will need dietitian input. 6. Tooth abscess on clindamycin is follow-up with her outpatient dentist.  Confirmed with pharmacy the clindamycin does not cause intractable hypertension and also symptoms were present even before starting the medicine.   Covid test is pending.  Since patient has idiopathic intracranial hypertension with nausea vomiting headache blurred vision requiring procedure and will need close follow-up after procedure will need inpatient status.   DVT prophylaxis: SCDs.  Since patient required procedure will avoid anticoagulation for now.  Patient also has severe anemia. Code Status: Full code. Family Communication: Patient's mother. Disposition Plan: Home. Consults called: ER physician discussed with neurologist. Admission status: Inpatient.   Eduard Clos MD [facility] Pager [phone].  If 7PM-7AM, please contact night-coverage [email or web address] Password [facility]  02/09/2020, 11:52 PM

## 2020-02-10 ENCOUNTER — Inpatient Hospital Stay (HOSPITAL_COMMUNITY): Payer: 59

## 2020-02-10 LAB — FERRITIN: Ferritin: 2 ng/mL — ABNORMAL LOW (ref 11–307)

## 2020-02-10 LAB — CBC
HCT: 32.6 % — ABNORMAL LOW (ref 36.0–46.0)
Hemoglobin: 8.8 g/dL — ABNORMAL LOW (ref 12.0–15.0)
MCH: 18.8 pg — ABNORMAL LOW (ref 26.0–34.0)
MCHC: 27 g/dL — ABNORMAL LOW (ref 30.0–36.0)
MCV: 69.8 fL — ABNORMAL LOW (ref 80.0–100.0)
Platelets: 393 10*3/uL (ref 150–400)
RBC: 4.67 MIL/uL (ref 3.87–5.11)
RDW: 23.5 % — ABNORMAL HIGH (ref 11.5–15.5)
WBC: 7.9 10*3/uL (ref 4.0–10.5)
nRBC: 0 % (ref 0.0–0.2)

## 2020-02-10 LAB — CSF CELL COUNT WITH DIFFERENTIAL
RBC Count, CSF: 170 /mm3 — ABNORMAL HIGH
Tube #: 1
WBC, CSF: 0 /mm3 (ref 0–5)

## 2020-02-10 LAB — GLUCOSE, CSF: Glucose, CSF: 66 mg/dL (ref 40–70)

## 2020-02-10 LAB — VITAMIN B12: Vitamin B-12: 273 pg/mL (ref 180–914)

## 2020-02-10 LAB — SARS CORONAVIRUS 2 BY RT PCR (HOSPITAL ORDER, PERFORMED IN ~~LOC~~ HOSPITAL LAB): SARS Coronavirus 2: NEGATIVE

## 2020-02-10 LAB — PREPARE RBC (CROSSMATCH)

## 2020-02-10 LAB — IRON AND TIBC
Iron: 17 ug/dL — ABNORMAL LOW (ref 28–170)
Saturation Ratios: 4 % — ABNORMAL LOW (ref 10.4–31.8)
TIBC: 403 ug/dL (ref 250–450)
UIBC: 386 ug/dL

## 2020-02-10 LAB — BASIC METABOLIC PANEL
Anion gap: 9 (ref 5–15)
BUN: 8 mg/dL (ref 6–20)
CO2: 25 mmol/L (ref 22–32)
Calcium: 8.8 mg/dL — ABNORMAL LOW (ref 8.9–10.3)
Chloride: 106 mmol/L (ref 98–111)
Creatinine, Ser: 0.51 mg/dL (ref 0.44–1.00)
GFR calc Af Amer: >60 mL/min (ref >60)
GFR calc non Af Amer: >60 mL/min (ref >60)
Glucose, Bld: 92 mg/dL (ref 70–99)
Potassium: 4 mmol/L (ref 3.5–5.1)
Sodium: 140 mmol/L (ref 135–145)

## 2020-02-10 LAB — PROTIME-INR
INR: 1.1 (ref 0.8–1.2)
Prothrombin Time: 13.3 seconds (ref 11.4–15.2)

## 2020-02-10 LAB — HIV ANTIBODY (ROUTINE TESTING W REFLEX): HIV Screen 4th Generation wRfx: NONREACTIVE

## 2020-02-10 LAB — ABO/RH: ABO/RH(D): B POS

## 2020-02-10 LAB — PROTEIN, CSF: Total  Protein, CSF: 22 mg/dL (ref 15–45)

## 2020-02-10 LAB — FOLATE: Folate: 10.3 ng/mL (ref >5.9)

## 2020-02-10 LAB — TSH: TSH: 1.192 u[IU]/mL (ref 0.350–4.500)

## 2020-02-10 IMAGING — RF DG FLUORO GUIDE SPINAL/SI JT INJ*L*
2 series · 2 of 2 positions shown · non-contrast
Comparison: none

CLINICAL DATA: Headache and blurred vision. Suspected intracranial
hypertension.

[Series 1: cp_standard · 0.17mm/px · 1 of 1 slices shown (1 of 2)]
[im 1/1]
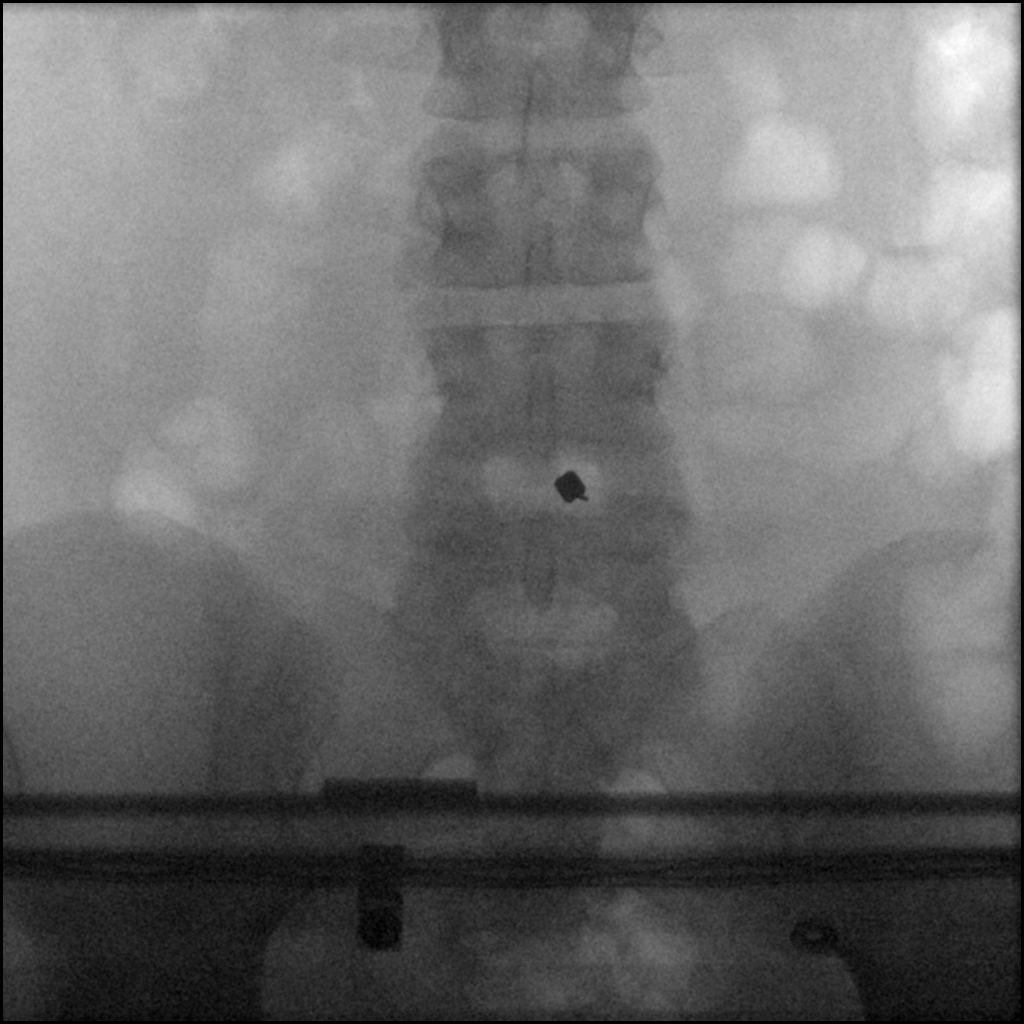

[Series 2: cp_standard · 0.17mm/px · 1 of 1 slices shown (2 of 2)]
[im 1/1]
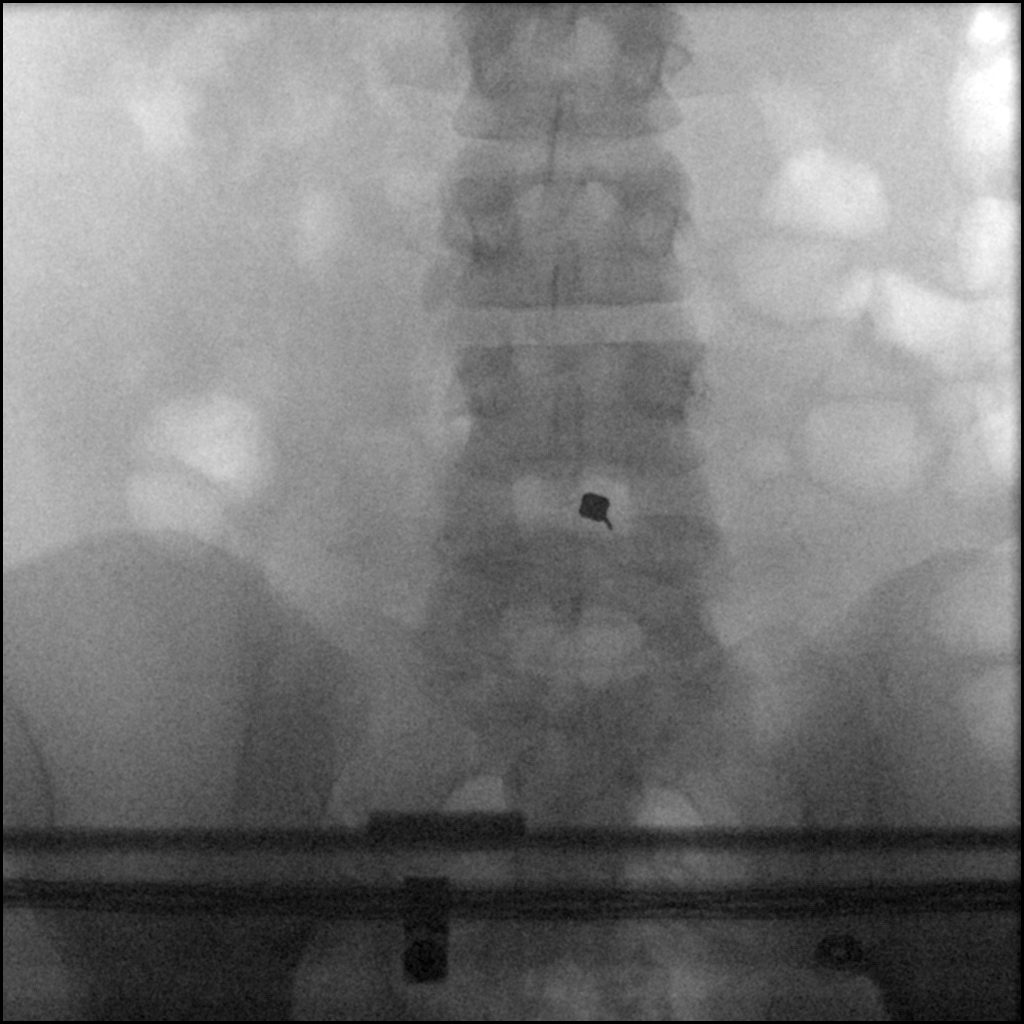

[2 of 2 positions shown; findings below may reference images not displayed]

EXAM:
DIAGNOSTIC LUMBAR PUNCTURE UNDER FLUOROSCOPIC GUIDANCE

FLUOROSCOPY TIME:  Fluoroscopy Time:  1 minutes 6 seconds

Radiation Exposure Index (if provided by the fluoroscopic device):
29.2 mGy

Number of Acquired Spot Images: 0

PROCEDURE:
Informed consent was obtained from the patient prior to the
procedure, including potential complications of headache, allergy,
and pain. With the patient prone, the lower back was prepped with
Betadine. 1% Lidocaine was used for local anesthesia. Lumbar
puncture was performed at the L4-5 level using a 20 gauge needle
with return of clear CSF with an opening pressure of 38 cm water. 5
ml of CSF were obtained for laboratory studies. The patient
tolerated the procedure well and there were no apparent
complications.
IMPRESSION: Successful diagnostic lumbar puncture at L4-5 under fluoroscopic
guidance. Elevated opening pressure of 38 cm H2O.

## 2020-02-10 MED ORDER — OXYCODONE HCL 5 MG PO TABS: 5.0000 mg | ORAL_TABLET | ORAL | Status: DC | PRN

## 2020-02-10 MED ORDER — ACETAMINOPHEN 325 MG PO TABS: 650.0000 mg | ORAL_TABLET | ORAL | Status: DC | PRN

## 2020-02-10 MED ORDER — POLYETHYLENE GLYCOL 3350 17 G PO PACK: 17.0000 g | PACK | Freq: Every day | ORAL | Status: DC | PRN

## 2020-02-10 MED ORDER — LIDOCAINE HCL 1 % IJ SOLN
INTRAMUSCULAR | Status: AC
  Filled 2020-02-10: qty 20

## 2020-02-10 MED ORDER — SENNOSIDES-DOCUSATE SODIUM 8.6-50 MG PO TABS
2.0000 | ORAL_TABLET | Freq: Two times a day (BID) | ORAL | Status: DC
  Filled 2020-02-10 (×2): qty 2

## 2020-02-10 MED ORDER — SODIUM CHLORIDE 0.9 % IV SOLN
510.0000 mg | Freq: Once | INTRAVENOUS | Status: AC
  Administered 2020-02-10: 510 mg via INTRAVENOUS
  Filled 2020-02-10: qty 510

## 2020-02-10 MED ORDER — FERROUS SULFATE 325 (65 FE) MG PO TABS
325.0000 mg | ORAL_TABLET | Freq: Two times a day (BID) | ORAL | Status: DC
  Administered 2020-02-11: 325 mg via ORAL
  Filled 2020-02-10: qty 1

## 2020-02-10 NOTE — Progress Notes (Signed)
PROGRESS NOTE    Labrittany Wechter Swanson  FBP:[id number] DOB: Apr 03, 1996 DOA: 02/09/2020 PCP: Knox Royalty, MD   Brief Narrative:  24 y.o. female with history of sleep apnea morbid obesity previous history of seizures presently not on any medication for the seizures for the last 20 years has been experiencing frontal headaches for the last 2 months and blurred vision for the last 2 weeks.  Had gone to the ophthalmologist for this on February 06, 2020 and ophthalmologist was concerned about idiopathic intracranial hypertension and was planning to do MRI of the brain as outpatient since patient symptoms got worse with patient having nausea vomiting today patient presents to the ER.  MRI/MRV shows features of idiopathic intracranial hypertension, case discussed with neurology recommended lumbar puncture with opening pressure.  Also found to have iron deficiency anemia.   Assessment & Plan:   Principal Problem:   Idiopathic intracranial hypertension Active Problems:   MENORRHAGIA   Mild intellectual disabilities   Anemia   Intracranial hypertension  Nausea vomiting with blurry vision, improved Idiopathic intracranial hypertension -Case discussed by admitted with neurology.  MRI/MRV shows signs of idiopathic intracranial hypertension. -Plans for lumbar puncture-will need opening pressure as well.  CSF studies ordered with it -Supportive care.  Iron deficiency anemia secondary to menorrhagia -2 units PRBC transfusion.  IV iron today followed by p.o. starting tomorrow with bowel regimen.  Obstructive sleep apnea Obesity with BMI greater than 35 -Bedtime CPAP. Body mass index is 36.81 kg/m.  Weight loss diet and exercise  Tooth abscess -Continue outpatient clindamycin.   DVT prophylaxis: SCDs Start: 02/09/20 2350 Code Status: Full code Family Communication: Mother at bedside  Status is: Inpatient  Remains inpatient appropriate because:Inpatient level of care appropriate due to severity of  illness   Dispo: The patient is from: Home              Anticipated d/c is to: Home              Anticipated d/c date is: 1 day              Patient currently is not medically stable to d/c.  Patient will need lumbar puncture today, IV iron.  Hopefully she will feel better by tomorrow we can discharge her.  Today she still having some nausea, diet as tolerated   Subjective: During my evaluation this morning she felt little better did not have any new complaints. She does admit of having heavy menstruation  Review of Systems Otherwise negative except as per HPI, including: General: Denies fever, chills, night sweats or unintended weight loss. Resp: Denies cough, wheezing, shortness of breath. Cardiac: Denies chest pain, palpitations, orthopnea, paroxysmal nocturnal dyspnea. GI: Denies abdominal pain,  diarrhea or constipation GU: Denies dysuria, frequency, hesitancy or incontinence MS: Denies muscle aches, joint pain or swelling Neuro: Denies headache, neurologic deficits (focal weakness, numbness, tingling), abnormal gait Psych: Denies anxiety, depression, SI/HI/AVH Skin: Denies new rashes or lesions ID: Denies sick contacts, exotic exposures, travel  Examination:  General exam: Appears calm and comfortable  Respiratory system: Clear to auscultation. Respiratory effort normal. Cardiovascular system: S1 & S2 heard, RRR. No JVD, murmurs, rubs, gallops or clicks. No pedal edema. Gastrointestinal system: Abdomen is nondistended, soft and nontender. No organomegaly or masses felt. Normal bowel sounds heard. Central nervous system: Alert and oriented. No focal neurological deficits. Extremities: Symmetric 5 x 5 power. Skin: No rashes, lesions or ulcers Psychiatry: Judgement and insight appear normal. Mood & affect appropriate.  Objective: Vitals:   02/10/20 0730 02/10/20 0838 02/10/20 1123 02/10/20 1126  BP:  111/66  102/68  Pulse: 84  84 82  Resp: 18  14 17   Temp:  98.2 F  (36.8 C)    TempSrc:  Oral    SpO2: 100%   99%  Weight:      Height:        Intake/Output Summary (Last 24 hours) at 02/10/2020 1132 Last data filed at 02/10/2020 2130 Gross per 24 hour  Intake 750 ml  Output --  Net 750 ml   Filed Weights   02/09/20 1618  Weight: 106.6 kg     Data Reviewed:   CBC: Recent Labs  Lab 02/07/20 1620 02/09/20 1657 02/10/20 1106  WBC 7.7 5.7 7.9  NEUTROABS  --  2.2  --   HGB 6.6* 6.5* 8.8*  HCT 26.3* 26.2* 32.6*  MCV 66.1* 66.7* 69.8*  PLT 279 369 865   Basic Metabolic Panel: Recent Labs  Lab 02/07/20 1620 02/09/20 1657 02/10/20 1106  NA 139 142 140  K 3.2* 3.7 4.0  CL 102 106 106  CO2 28 26 25   GLUCOSE 112* 82 92  BUN 6 6 8   CREATININE 0.53 0.45 0.51  CALCIUM 8.5* 8.6* 8.8*   GFR: Estimated Creatinine Clearance: 136.3 mL/min (by C-G formula based on SCr of 0.51 mg/dL). Liver Function Tests: Recent Labs  Lab 02/09/20 1657  AST 18  ALT 11  ALKPHOS 59  BILITOT 0.5  PROT 6.9  ALBUMIN 3.5   No results for input(s): LIPASE, AMYLASE in the last 168 hours. No results for input(s): AMMONIA in the last 168 hours. Coagulation Profile: Recent Labs  Lab 02/10/20 0852  INR 1.1   Cardiac Enzymes: No results for input(s): CKTOTAL, CKMB, CKMBINDEX, TROPONINI in the last 168 hours. BNP (last 3 results) No results for input(s): PROBNP in the last 8760 hours. HbA1C: No results for input(s): HGBA1C in the last 72 hours. CBG: No results for input(s): GLUCAP in the last 168 hours. Lipid Profile: No results for input(s): CHOL, HDL, LDLCALC, TRIG, CHOLHDL, LDLDIRECT in the last 72 hours. Thyroid Function Tests: Recent Labs    02/09/20 2316  TSH 1.192   Anemia Panel: Recent Labs    02/09/20 2316  VITAMINB12 273  FOLATE 10.3  FERRITIN 2*  TIBC 403  IRON 17*  RETICCTPCT 1.6   Sepsis Labs: No results for input(s): PROCALCITON, LATICACIDVEN in the last 168 hours.  Recent Results (from the past 240 hour(s))  SARS  Coronavirus 2 by RT PCR (hospital order, performed in [facility] hospital lab) Nasopharyngeal Nasopharyngeal Swab     Status: None   Collection Time: 02/09/20 11:16 PM   Specimen: Nasopharyngeal Swab  Result Value Ref Range Status   SARS Coronavirus 2 NEGATIVE NEGATIVE Final    Comment: (NOTE) SARS-CoV-2 target nucleic acids are NOT DETECTED.  The SARS-CoV-2 RNA is generally detectable in upper and lower respiratory specimens during the acute phase of infection. The lowest concentration of SARS-CoV-2 viral copies this assay can detect is 250 copies / mL. A negative result does not preclude SARS-CoV-2 infection and should not be used as the sole basis for treatment or other patient management decisions.  A negative result may occur with improper specimen collection / handling, submission of specimen other than nasopharyngeal swab, presence of viral mutation(s) within the areas targeted by this assay, and inadequate number of viral copies (<250 copies / mL). A negative result must be combined with clinical observations,  patient history, and epidemiological information.  Fact Sheet for Patients:   BoilerBrush.com.cy  Fact Sheet for Healthcare Providers: https://pope.com/  This test is not yet approved or  cleared by the [place] FDA and has been authorized for detection and/or diagnosis of SARS-CoV-2 by FDA under an Emergency Use Authorization (EUA).  This EUA will remain in effect (meaning this test can be used) for the duration of the COVID-19 declaration under Section 564(b)(1) of the Act, 21 U.S.C. section 360bbb-3(b)(1), unless the authorization is terminated or revoked sooner.  Performed at [facility], [address] [place], [place] [address]          Radiology Studies: MR BRAIN WO CONTRAST  Result Date: 02/09/2020 CLINICAL DATA:  Headache, blurry vision EXAM: MRI HEAD AND ORBITS WITHOUT  CONTRAST MRV HEAD WITHOUT CONTRAST TECHNIQUE: Multiplanar, multiecho pulse sequences of the brain and surrounding structures were obtained without intravenous contrast. Multiplanar, multiecho pulse sequences of the orbits and surrounding structures were obtained including fat saturation techniques, without intravenous contrast administration. Angiographic images of the intracranial venous structures were obtained using MRV technique without intravenous contrast. COMPARISON:  None. FINDINGS: MRI HEAD Brain: There is no acute infarction or intracranial hemorrhage. There is no intracranial mass, mass effect, hydrocephalus, or extra-axial fluid collection. Vascular: Major vessel flow voids at the skull base are preserved. Skull and upper cervical spine: Normal marrow signal. Other: Partially empty sella. Probable small left petrous apex cephalocele. These nonspecific findings could indicate chronically raised intracranial pressure. MRI ORBITS Orbits: Globes are symmetric and unremarkable. Mild distension of the optic nerve sheath complexes. No imaging evidence of papilledema. Questionable T2 hyperintensity of the posterior intraorbital optic nerves. No intraorbital mass. Visualized paranasal sinuses: Minor mucosal thickening. Soft tissues: Unremarkable. MRV HEAD The superior sagittal sinus, straight sinus, vein of Galen, internal cerebral veins, and both transverse and sigmoid sinuses are patent. There is stenosis at the bilateral transverse-sigmoid sinus junctions. IMPRESSION: No mass or abnormal enhancement. No evidence of dural venous sinus thrombosis. Constellation of findings suggesting idiopathic intracranial hypertension. Questionable abnormal signal of the posterior intraorbital optic nerves favored to be artifactual. Electronically Signed   By: Guadlupe Spanish M.D.   On: 02/09/2020 19:08   MR MRV HEAD WO CM  Result Date: 02/09/2020 CLINICAL DATA:  Headache, blurry vision EXAM: MRI HEAD AND ORBITS WITHOUT  CONTRAST MRV HEAD WITHOUT CONTRAST TECHNIQUE: Multiplanar, multiecho pulse sequences of the brain and surrounding structures were obtained without intravenous contrast. Multiplanar, multiecho pulse sequences of the orbits and surrounding structures were obtained including fat saturation techniques, without intravenous contrast administration. Angiographic images of the intracranial venous structures were obtained using MRV technique without intravenous contrast. COMPARISON:  None. FINDINGS: MRI HEAD Brain: There is no acute infarction or intracranial hemorrhage. There is no intracranial mass, mass effect, hydrocephalus, or extra-axial fluid collection. Vascular: Major vessel flow voids at the skull base are preserved. Skull and upper cervical spine: Normal marrow signal. Other: Partially empty sella. Probable small left petrous apex cephalocele. These nonspecific findings could indicate chronically raised intracranial pressure. MRI ORBITS Orbits: Globes are symmetric and unremarkable. Mild distension of the optic nerve sheath complexes. No imaging evidence of papilledema. Questionable T2 hyperintensity of the posterior intraorbital optic nerves. No intraorbital mass. Visualized paranasal sinuses: Minor mucosal thickening. Soft tissues: Unremarkable. MRV HEAD The superior sagittal sinus, straight sinus, vein of Galen, internal cerebral veins, and both transverse and sigmoid sinuses are patent. There is stenosis at the bilateral transverse-sigmoid sinus junctions. IMPRESSION: No mass or  abnormal enhancement. No evidence of dural venous sinus thrombosis. Constellation of findings suggesting idiopathic intracranial hypertension. Questionable abnormal signal of the posterior intraorbital optic nerves favored to be artifactual. Electronically Signed   By: Guadlupe Spanish M.D.   On: 02/09/2020 19:08   MR ORBITS WO CONTRAST  Result Date: 02/09/2020 CLINICAL DATA:  Headache, blurry vision EXAM: MRI HEAD AND ORBITS WITHOUT  CONTRAST MRV HEAD WITHOUT CONTRAST TECHNIQUE: Multiplanar, multiecho pulse sequences of the brain and surrounding structures were obtained without intravenous contrast. Multiplanar, multiecho pulse sequences of the orbits and surrounding structures were obtained including fat saturation techniques, without intravenous contrast administration. Angiographic images of the intracranial venous structures were obtained using MRV technique without intravenous contrast. COMPARISON:  None. FINDINGS: MRI HEAD Brain: There is no acute infarction or intracranial hemorrhage. There is no intracranial mass, mass effect, hydrocephalus, or extra-axial fluid collection. Vascular: Major vessel flow voids at the skull base are preserved. Skull and upper cervical spine: Normal marrow signal. Other: Partially empty sella. Probable small left petrous apex cephalocele. These nonspecific findings could indicate chronically raised intracranial pressure. MRI ORBITS Orbits: Globes are symmetric and unremarkable. Mild distension of the optic nerve sheath complexes. No imaging evidence of papilledema. Questionable T2 hyperintensity of the posterior intraorbital optic nerves. No intraorbital mass. Visualized paranasal sinuses: Minor mucosal thickening. Soft tissues: Unremarkable. MRV HEAD The superior sagittal sinus, straight sinus, vein of Galen, internal cerebral veins, and both transverse and sigmoid sinuses are patent. There is stenosis at the bilateral transverse-sigmoid sinus junctions. IMPRESSION: No mass or abnormal enhancement. No evidence of dural venous sinus thrombosis. Constellation of findings suggesting idiopathic intracranial hypertension. Questionable abnormal signal of the posterior intraorbital optic nerves favored to be artifactual. Electronically Signed   By: Guadlupe Spanish M.D.   On: 02/09/2020 19:08        Scheduled Meds: . clindamycin  150 mg Oral TID  . [START ON 02/11/2020] ferrous sulfate  325 mg Oral BID WC  .  lidocaine      . senna-docusate  2 tablet Oral BID   Continuous Infusions:   LOS: 1 day   Time spent= 35 mins    Keegan Bensch Joline Maxcy, MD [facility]  If 7PM-7AM, please contact night-coverage  02/10/2020, 11:32 AM

## 2020-02-10 NOTE — ED Notes (Signed)
[name] has just called and tells [place] that there will be a delay on the arrival of a radiologist at [facility]. Dr. Effie Shy is aware.

## 2020-02-10 NOTE — ED Notes (Signed)
She continues to comfortably watch TV with her mom. She has no requests at this time. I have attempted to obtain lab tests, and was unsuccessful. I will have a colleague attempt shortly.

## 2020-02-10 NOTE — ED Notes (Signed)
She is awake, alert and comfortably watching TV. She has a female visitor present also. Her speech is clear and she is oriented x 4. She has no requests at this time.

## 2020-02-10 NOTE — Progress Notes (Signed)
Pt has refused CPAP QHS--Pt states that she no longer wears at home.

## 2020-02-11 LAB — TYPE AND SCREEN
ABO/RH(D): B POS
Antibody Screen: NEGATIVE
Unit division: 0
Unit division: 0

## 2020-02-11 LAB — CBC
HCT: 32.4 % — ABNORMAL LOW (ref 36.0–46.0)
Hemoglobin: 8.8 g/dL — ABNORMAL LOW (ref 12.0–15.0)
MCH: 19 pg — ABNORMAL LOW (ref 26.0–34.0)
MCHC: 27.2 g/dL — ABNORMAL LOW (ref 30.0–36.0)
MCV: 70 fL — ABNORMAL LOW (ref 80.0–100.0)
Platelets: 402 10*3/uL — ABNORMAL HIGH (ref 150–400)
RBC: 4.63 MIL/uL (ref 3.87–5.11)
RDW: 23.8 % — ABNORMAL HIGH (ref 11.5–15.5)
WBC: 6 10*3/uL (ref 4.0–10.5)
nRBC: 0.5 % — ABNORMAL HIGH (ref 0.0–0.2)

## 2020-02-11 LAB — BASIC METABOLIC PANEL
Anion gap: 9 (ref 5–15)
BUN: 7 mg/dL (ref 6–20)
CO2: 24 mmol/L (ref 22–32)
Calcium: 8.3 mg/dL — ABNORMAL LOW (ref 8.9–10.3)
Chloride: 107 mmol/L (ref 98–111)
Creatinine, Ser: 0.45 mg/dL (ref 0.44–1.00)
GFR calc Af Amer: >60 mL/min (ref >60)
GFR calc non Af Amer: >60 mL/min (ref >60)
Glucose, Bld: 84 mg/dL (ref 70–99)
Potassium: 4.2 mmol/L (ref 3.5–5.1)
Sodium: 140 mmol/L (ref 135–145)

## 2020-02-11 LAB — BPAM RBC
Blood Product Expiration Date: 202107252359
Blood Product Expiration Date: 202107252359
ISSUE DATE / TIME: 202106260110
ISSUE DATE / TIME: 202106260537
Unit Type and Rh: 7300
Unit Type and Rh: 7300

## 2020-02-11 LAB — MAGNESIUM: Magnesium: 2.1 mg/dL (ref 1.7–2.4)

## 2020-02-11 MED ORDER — FERROUS SULFATE 325 (65 FE) MG PO TABS: 325.0000 mg | ORAL_TABLET | Freq: Two times a day (BID) | ORAL | 0 refills | Status: DC

## 2020-02-11 MED ORDER — ACETAZOLAMIDE 125 MG PO TABS: 250.0000 mg | ORAL_TABLET | Freq: Two times a day (BID) | ORAL | 0 refills | Status: DC

## 2020-02-11 MED ORDER — POLYETHYLENE GLYCOL 3350 17 G PO PACK: 17.0000 g | PACK | Freq: Every day | ORAL | 0 refills | Status: DC | PRN

## 2020-02-11 MED ORDER — SENNOSIDES-DOCUSATE SODIUM 8.6-50 MG PO TABS: 1.0000 | ORAL_TABLET | Freq: Two times a day (BID) | ORAL | 0 refills | Status: DC

## 2020-02-11 NOTE — Discharge Instructions (Signed)
Idiopathic Intracranial Hypertension  Idiopathic intracranial hypertension (IIH) is a condition that increases pressure around the brain. The fluid that surrounds the brain and spinal cord (cerebrospinal fluid, CSF) increases and causes the pressure. Idiopathic means that the cause of this condition is not known. IIH affects the brain and spinal cord (is a neurological disorder). If this condition is not treated, it can cause vision loss or blindness. What increases the risk? You are more likely to develop this condition if:  You are severely overweight (obese).  You are a woman who has not gone through menopause.  You take certain medicines, such as birth control or steroids. What are the signs or symptoms? Symptoms of IIH include:  Headaches. This is the most common symptom.  Pain in the shoulders or neck.  Nausea and vomiting.  A "rushing water" or pulsing sound within the ears (pulsatile tinnitus).  Double vision.  Blurred vision.  Brief episodes of complete vision loss. How is this diagnosed? This condition may be diagnosed based on:  Your symptoms.  Your medical history.  CT scan of the brain.  MRI of the brain.  Magnetic resonance venogram (MRV) to check veins in the brain.  Diagnostic lumbar puncture. This is a procedure to remove and examine a sample of cerebrospinal fluid. This procedure can determine whether too much fluid may be causing IIH.  A thorough eye exam to check for swelling or nerve damage in the eyes. How is this treated? Treatment for this condition depends on your symptoms. The goal of treatment is to decrease the pressure around your brain. Common treatments include:  Medicines to decrease the production of spinal fluid and lower the pressure within your skull.  Medicines to prevent or treat headaches.  Surgery to place drains (shunts) in your brain to remove excess fluid.  Lumbar puncture to remove excess cerebrospinal fluid. Follow  these instructions at home:  If you are overweight or obese, work with your health care provider to lose weight.  Take over-the-counter and prescription medicines only as told by your health care provider.  Do not drive or use heavy machinery while taking medicines that can make you sleepy.  Keep all follow-up visits as told by your health care provider. This is important. Contact a health care provider if:  You have changes in your vision, such as: ? Double vision. ? Not being able to see colors (color vision). Get help right away if:  You have any of the following symptoms and they get worse or do not get better. ? Headaches. ? Nausea. ? Vomiting. ? Vision changes or difficulty seeing. Summary  Idiopathic intracranial hypertension (IIH) is a condition that increases pressure around the brain. The cause is not known (is idiopathic).  The most common symptom of IIH is headaches.  Treatment may include medicines or surgery to relieve the pressure on your brain. This information is not intended to replace advice given to you by your health care provider. Make sure you discuss any questions you have with your health care provider. Document Revised: 07/16/2017 Document Reviewed: 06/24/2016 Elsevier Patient Education  2020 [employer]  

## 2020-02-11 NOTE — Discharge Summary (Signed)
Physician Discharge Summary  Robbin Loughmiller Kuyper SNK:[id number] DOB: Apr 15, 1996 DOA: 02/09/2020  PCP: Knox Royalty, MD  Admit date: 02/09/2020 Discharge date: 02/11/2020  Admitted From: Home Disposition: Home  Recommendations for Outpatient Follow-up:  1. Follow up with PCP in 1-2 weeks 2. Please obtain BMP/CBC in one week your next doctors visit.  3. Acetazolamide twice daily 4. Iron supplements twice daily with bowel regimen   Discharge Condition: Stable CODE STATUS: Full Diet recommendation: Regular  Brief/Interim Summary: 24 y.o.femalewithhistory of sleep apnea morbid obesity previous history of seizures presently not on any medication for the seizures for the last 20 years has been experiencing frontal headaches for the last 2 months and blurred vision for the last 2 weeks. Had gone to the ophthalmologist for this on February 06, 2020 and ophthalmologist was concerned about idiopathic intracranial hypertension and was planning to do MRI of the brain as outpatient since patient symptoms got worse with patient having nausea vomiting today patient presents to the ER.  MRI/MRV shows features of idiopathic intracranial hypertension, case discussed with neurology recommended lumbar puncture with opening pressure.  Also found to have iron deficiency anemia.  LP showed elevated opening pressure, cultures were negative.  Started on acetazolamide.  Her headaches resolved.  Stable for discharge today.  Mother at bedside during my interactions with her even on the day of discharge therefore instructions given to her.   Assessment & Plan:   Principal Problem:   Idiopathic intracranial hypertension Active Problems:   MENORRHAGIA   Mild intellectual disabilities   Anemia   Intracranial hypertension  Nausea vomiting with blurry vision, resolved Idiopathic intracranial hypertension -Case discussed by admitted with neurology.  MRI/MRV shows signs of idiopathic intracranial hypertension. -LP  6/26-elevated opening pressure.  Cultures negative no evidence of infection.  Started acetazolamide twice daily, follow-up outpatient primary care provider and thereafter neurology and ophthalmology if needed  Iron deficiency anemia secondary to menorrhagia -2 units PRBC transfusion.  Status post IV iron.  Now on iron supplements twice daily with bowel regimen.  Obstructive sleep apnea Obesity with BMI greater than 35 -Bedtime CPAP. Body mass index is 36.81 kg/m.  Weight loss diet and exercise Recommend outpatient sleep study  Tooth abscess -Continue outpatient clindamycin.  Follow-up per outpatient dentist   Body mass index is 36.81 kg/m.     Discharge Diagnoses:  Principal Problem:   Idiopathic intracranial hypertension Active Problems:   MENORRHAGIA   Mild intellectual disabilities   Anemia   Intracranial hypertension      Consultations:  Neurology, curbside by admitting provider  Subjective: Feels great no complaints of back to her baseline.  Tolerated LP yesterday.  Discharge Exam: Vitals:   02/11/20 0540 02/11/20 0837  BP: 124/89 (!) 141/87  Pulse: 81 78  Resp: 18 16  Temp: (!) 97.5 F (36.4 C) 98 F (36.7 C)  SpO2: 98% 100%   Vitals:   02/10/20 2108 02/11/20 0108 02/11/20 0540 02/11/20 0837  BP: 136/82 122/83 124/89 (!) 141/87  Pulse: 79 72 81 78  Resp: 18 18 18 16   Temp: 97.6 F (36.4 C) 97.7 F (36.5 C) (!) 97.5 F (36.4 C) 98 F (36.7 C)  TempSrc:    Oral  SpO2: 100% 100% 98% 100%  Weight:      Height:        General: Pt is alert, awake, not in acute distress Cardiovascular: RRR, S1/S2 +, no rubs, no gallops Respiratory: CTA bilaterally, no wheezing, no rhonchi Abdominal: Soft, NT, ND, bowel sounds +  Extremities: no edema, no cyanosis  Discharge Instructions   You were cared for by a hospitalist during your hospital stay. If you have any questions about your discharge medications or the care you received while you were in the  hospital after you are discharged, you can call the unit and asked to speak with the hospitalist on call if the hospitalist that took care of you is not available. Once you are discharged, your primary care physician will handle any further medical issues. Please note that no refills for any discharge medications will be authorized once you are discharged, as it is imperative that you return to your primary care physician (or establish a relationship with a primary care physician if you do not have one) for your aftercare needs so that they can reassess your need for medications and monitor your lab values.   Procedures/Studies: MR BRAIN WO CONTRAST  Result Date: 02/09/2020 CLINICAL DATA:  Headache, blurry vision EXAM: MRI HEAD AND ORBITS WITHOUT CONTRAST MRV HEAD WITHOUT CONTRAST TECHNIQUE: Multiplanar, multiecho pulse sequences of the brain and surrounding structures were obtained without intravenous contrast. Multiplanar, multiecho pulse sequences of the orbits and surrounding structures were obtained including fat saturation techniques, without intravenous contrast administration. Angiographic images of the intracranial venous structures were obtained using MRV technique without intravenous contrast. COMPARISON:  None. FINDINGS: MRI HEAD Brain: There is no acute infarction or intracranial hemorrhage. There is no intracranial mass, mass effect, hydrocephalus, or extra-axial fluid collection. Vascular: Major vessel flow voids at the skull base are preserved. Skull and upper cervical spine: Normal marrow signal. Other: Partially empty sella. Probable small left petrous apex cephalocele. These nonspecific findings could indicate chronically raised intracranial pressure. MRI ORBITS Orbits: Globes are symmetric and unremarkable. Mild distension of the optic nerve sheath complexes. No imaging evidence of papilledema. Questionable T2 hyperintensity of the posterior intraorbital optic nerves. No intraorbital mass.  Visualized paranasal sinuses: Minor mucosal thickening. Soft tissues: Unremarkable. MRV HEAD The superior sagittal sinus, straight sinus, vein of Galen, internal cerebral veins, and both transverse and sigmoid sinuses are patent. There is stenosis at the bilateral transverse-sigmoid sinus junctions. IMPRESSION: No mass or abnormal enhancement. No evidence of dural venous sinus thrombosis. Constellation of findings suggesting idiopathic intracranial hypertension. Questionable abnormal signal of the posterior intraorbital optic nerves favored to be artifactual. Electronically Signed   By: Guadlupe Spanish M.D.   On: 02/09/2020 19:08   MR MRV HEAD WO CM  Result Date: 02/09/2020 CLINICAL DATA:  Headache, blurry vision EXAM: MRI HEAD AND ORBITS WITHOUT CONTRAST MRV HEAD WITHOUT CONTRAST TECHNIQUE: Multiplanar, multiecho pulse sequences of the brain and surrounding structures were obtained without intravenous contrast. Multiplanar, multiecho pulse sequences of the orbits and surrounding structures were obtained including fat saturation techniques, without intravenous contrast administration. Angiographic images of the intracranial venous structures were obtained using MRV technique without intravenous contrast. COMPARISON:  None. FINDINGS: MRI HEAD Brain: There is no acute infarction or intracranial hemorrhage. There is no intracranial mass, mass effect, hydrocephalus, or extra-axial fluid collection. Vascular: Major vessel flow voids at the skull base are preserved. Skull and upper cervical spine: Normal marrow signal. Other: Partially empty sella. Probable small left petrous apex cephalocele. These nonspecific findings could indicate chronically raised intracranial pressure. MRI ORBITS Orbits: Globes are symmetric and unremarkable. Mild distension of the optic nerve sheath complexes. No imaging evidence of papilledema. Questionable T2 hyperintensity of the posterior intraorbital optic nerves. No intraorbital mass.  Visualized paranasal sinuses: Minor mucosal thickening. Soft tissues: Unremarkable. MRV HEAD  The superior sagittal sinus, straight sinus, vein of Galen, internal cerebral veins, and both transverse and sigmoid sinuses are patent. There is stenosis at the bilateral transverse-sigmoid sinus junctions. IMPRESSION: No mass or abnormal enhancement. No evidence of dural venous sinus thrombosis. Constellation of findings suggesting idiopathic intracranial hypertension. Questionable abnormal signal of the posterior intraorbital optic nerves favored to be artifactual. Electronically Signed   By: Macy Mis M.D.   On: 02/09/2020 19:08   MR ORBITS WO CONTRAST  Result Date: 02/09/2020 CLINICAL DATA:  Headache, blurry vision EXAM: MRI HEAD AND ORBITS WITHOUT CONTRAST MRV HEAD WITHOUT CONTRAST TECHNIQUE: Multiplanar, multiecho pulse sequences of the brain and surrounding structures were obtained without intravenous contrast. Multiplanar, multiecho pulse sequences of the orbits and surrounding structures were obtained including fat saturation techniques, without intravenous contrast administration. Angiographic images of the intracranial venous structures were obtained using MRV technique without intravenous contrast. COMPARISON:  None. FINDINGS: MRI HEAD Brain: There is no acute infarction or intracranial hemorrhage. There is no intracranial mass, mass effect, hydrocephalus, or extra-axial fluid collection. Vascular: Major vessel flow voids at the skull base are preserved. Skull and upper cervical spine: Normal marrow signal. Other: Partially empty sella. Probable small left petrous apex cephalocele. These nonspecific findings could indicate chronically raised intracranial pressure. MRI ORBITS Orbits: Globes are symmetric and unremarkable. Mild distension of the optic nerve sheath complexes. No imaging evidence of papilledema. Questionable T2 hyperintensity of the posterior intraorbital optic nerves. No intraorbital mass.  Visualized paranasal sinuses: Minor mucosal thickening. Soft tissues: Unremarkable. MRV HEAD The superior sagittal sinus, straight sinus, vein of Galen, internal cerebral veins, and both transverse and sigmoid sinuses are patent. There is stenosis at the bilateral transverse-sigmoid sinus junctions. IMPRESSION: No mass or abnormal enhancement. No evidence of dural venous sinus thrombosis. Constellation of findings suggesting idiopathic intracranial hypertension. Questionable abnormal signal of the posterior intraorbital optic nerves favored to be artifactual. Electronically Signed   By: Macy Mis M.D.   On: 02/09/2020 19:08   DG FLUORO GUIDE LUMBAR PUNCTURE  Result Date: 02/10/2020 CLINICAL DATA:  Headache and blurred vision. Suspected intracranial hypertension. EXAM: DIAGNOSTIC LUMBAR PUNCTURE UNDER FLUOROSCOPIC GUIDANCE FLUOROSCOPY TIME:  Fluoroscopy Time:  1 minutes 6 seconds Radiation Exposure Index (if provided by the fluoroscopic device): 29.2 mGy Number of Acquired Spot Images: 0 PROCEDURE: Informed consent was obtained from the patient prior to the procedure, including potential complications of headache, allergy, and pain. With the patient prone, the lower back was prepped with Betadine. 1% Lidocaine was used for local anesthesia. Lumbar puncture was performed at the L4-5 level using a 20 gauge needle with return of clear CSF with an opening pressure of 38 cm water. 5 ml of CSF were obtained for laboratory studies. The patient tolerated the procedure well and there were no apparent complications. IMPRESSION: Successful diagnostic lumbar puncture at L4-5 under fluoroscopic guidance. Elevated opening pressure of 38 cm H2O. Electronically Signed   By: Marlaine Hind M.D.   On: 02/10/2020 13:53     The results of significant diagnostics from this hospitalization (including imaging, microbiology, ancillary and laboratory) are listed below for reference.     Microbiology: Recent Results (from the  past 240 hour(s))  SARS Coronavirus 2 by RT PCR (hospital order, performed in [facility] hospital lab) Nasopharyngeal Nasopharyngeal Swab     Status: None   Collection Time: 02/09/20 11:16 PM   Specimen: Nasopharyngeal Swab  Result Value Ref Range Status   SARS Coronavirus 2 NEGATIVE NEGATIVE Final  Comment: (NOTE) SARS-CoV-2 target nucleic acids are NOT DETECTED.  The SARS-CoV-2 RNA is generally detectable in upper and lower respiratory specimens during the acute phase of infection. The lowest concentration of SARS-CoV-2 viral copies this assay can detect is 250 copies / mL. A negative result does not preclude SARS-CoV-2 infection and should not be used as the sole basis for treatment or other patient management decisions.  A negative result may occur with improper specimen collection / handling, submission of specimen other than nasopharyngeal swab, presence of viral mutation(s) within the areas targeted by this assay, and inadequate number of viral copies (<250 copies / mL). A negative result must be combined with clinical observations, patient history, and epidemiological information.  Fact Sheet for Patients:   BoilerBrush.com.cyhttps://www.fda.gov/media/136312/download  Fact Sheet for Healthcare Providers: https://pope.com/https://www.fda.gov/media/136313/download  This test is not yet approved or  cleared by the [place] FDA and has been authorized for detection and/or diagnosis of SARS-CoV-2 by FDA under an Emergency Use Authorization (EUA).  This EUA will remain in effect (meaning this test can be used) for the duration of the COVID-19 declaration under Section 564(b)(1) of the Act, 21 U.S.C. section 360bbb-3(b)(1), unless the authorization is terminated or revoked sooner.  Performed at [facility], [address] [place], [place] [address]   CSF culture     Status: None (Preliminary result)   Collection Time: 02/10/20  1:20 PM   Specimen: PATH Cytology CSF; Cerebrospinal  Fluid  Result Value Ref Range Status   Specimen Description CSF  Final   Special Requests NONE  Final   Gram Stain   Final    WBC PRESENT, PREDOMINANTLY MONONUCLEAR NO ORGANISMS SEEN CYTOSPIN SMEAR NOTIFIED BLOUM,K RN @1612  ON 02/10/23 JACKSON,K Performed at [facility], [address] [place], [place] [address]    Culture PENDING  Incomplete   Report Status PENDING  Incomplete     Labs: BNP (last 3 results) No results for input(s): BNP in the last 8760 hours. Basic Metabolic Panel: Recent Labs  Lab 02/07/20 1620 02/09/20 1657 02/10/20 1106 02/11/20 0536  NA 139 142 140 140  K 3.2* 3.7 4.0 4.2  CL 102 106 106 107  CO2 28 26 25 24   GLUCOSE 112* 82 92 84  BUN 6 6 8 7   CREATININE 0.53 0.45 0.51 0.45  CALCIUM 8.5* 8.6* 8.8* 8.3*  MG  --   --   --  2.1   Liver Function Tests: Recent Labs  Lab 02/09/20 1657  AST 18  ALT 11  ALKPHOS 59  BILITOT 0.5  PROT 6.9  ALBUMIN 3.5   No results for input(s): LIPASE, AMYLASE in the last 168 hours. No results for input(s): AMMONIA in the last 168 hours. CBC: Recent Labs  Lab 02/07/20 1620 02/09/20 1657 02/10/20 1106 02/11/20 0536  WBC 7.7 5.7 7.9 6.0  NEUTROABS  --  2.2  --   --   HGB 6.6* 6.5* 8.8* 8.8*  HCT 26.3* 26.2* 32.6* 32.4*  MCV 66.1* 66.7* 69.8* 70.0*  PLT 279 369 393 402*   Cardiac Enzymes: No results for input(s): CKTOTAL, CKMB, CKMBINDEX, TROPONINI in the last 168 hours. BNP: Invalid input(s): POCBNP CBG: No results for input(s): GLUCAP in the last 168 hours. D-Dimer No results for input(s): DDIMER in the last 72 hours. Hgb A1c No results for input(s): HGBA1C in the last 72 hours. Lipid Profile No results for input(s): CHOL, HDL, LDLCALC, TRIG, CHOLHDL, LDLDIRECT in the last 72 hours. Thyroid function studies  Recent Labs    02/09/20 2316  TSH 1.192   Anemia work up Recent Labs    02/09/20 2316  VITAMINB12 273  FOLATE 10.3  FERRITIN 2*  TIBC 403  IRON 17*  RETICCTPCT  1.6   Urinalysis    Component Value Date/Time   COLORURINE yellow 06/22/2007 1024   APPEARANCEUR Clear 06/22/2007 1024   LABSPEC 1.020 06/22/2007 1024   PHURINE 7.0 06/22/2007 1024   HGBUR negative 06/22/2007 1024   BILIRUBINUR negative 06/22/2007 1024   UROBILINOGEN 0.2 06/22/2007 1024   NITRITE negative 06/22/2007 1024   Sepsis Labs Invalid input(s): PROCALCITONIN,  WBC,  LACTICIDVEN Microbiology Recent Results (from the past 240 hour(s))  SARS Coronavirus 2 by RT PCR (hospital order, performed in [facility] hospital lab) Nasopharyngeal Nasopharyngeal Swab     Status: None   Collection Time: 02/09/20 11:16 PM   Specimen: Nasopharyngeal Swab  Result Value Ref Range Status   SARS Coronavirus 2 NEGATIVE NEGATIVE Final    Comment: (NOTE) SARS-CoV-2 target nucleic acids are NOT DETECTED.  The SARS-CoV-2 RNA is generally detectable in upper and lower respiratory specimens during the acute phase of infection. The lowest concentration of SARS-CoV-2 viral copies this assay can detect is 250 copies / mL. A negative result does not preclude SARS-CoV-2 infection and should not be used as the sole basis for treatment or other patient management decisions.  A negative result may occur with improper specimen collection / handling, submission of specimen other than nasopharyngeal swab, presence of viral mutation(s) within the areas targeted by this assay, and inadequate number of viral copies (<250 copies / mL). A negative result must be combined with clinical observations, patient history, and epidemiological information.  Fact Sheet for Patients:   BoilerBrush.com.cy  Fact Sheet for Healthcare Providers: https://pope.com/  This test is not yet approved or  cleared by the [place] FDA and has been authorized for detection and/or diagnosis of SARS-CoV-2 by FDA under an Emergency Use Authorization (EUA).  This EUA will remain in  effect (meaning this test can be used) for the duration of the COVID-19 declaration under Section 564(b)(1) of the Act, 21 U.S.C. section 360bbb-3(b)(1), unless the authorization is terminated or revoked sooner.  Performed at [facility], [address], [place], [place] [address]   CSF culture     Status: None (Preliminary result)   Collection Time: 02/10/20  1:20 PM   Specimen: PATH Cytology CSF; Cerebrospinal Fluid  Result Value Ref Range Status   Specimen Description CSF  Final   Special Requests NONE  Final   Gram Stain   Final    WBC PRESENT, PREDOMINANTLY MONONUCLEAR NO ORGANISMS SEEN CYTOSPIN SMEAR NOTIFIED BLOUM,K RN @1612  ON 02/10/23 JACKSON,K Performed at [facility], [address] [place], [place]    Culture PENDING  Incomplete   Report Status PENDING  Incomplete     Time coordinating discharge:  I have spent 35 minutes face to face with the patient and on the ward discussing the patients care, assessment, plan and disposition with other care givers. >50% of the time was devoted counseling the patient about the risks and benefits of treatment/Discharge disposition and coordinating care.   SIGNED:   [id number], MD  [facility] 02/11/2020, 9:10 AM   If 7PM-7AM, please contact night-coverage

## 2020-02-11 NOTE — Progress Notes (Signed)
Nurse reviewed discharge instructions with pt and her mother.  Pt and mother verbalized understanding of discharge instructions, follow up appointments and new medications.  All questions answered prior to discharge.

## 2020-02-12 LAB — CYTOLOGY - NON PAP

## 2020-02-14 LAB — CSF CULTURE W GRAM STAIN: Culture: NO GROWTH

## 2020-02-16 LAB — ANAEROBIC CULTURE

## 2020-02-20 ENCOUNTER — Other Ambulatory Visit: Payer: Self-pay | Admitting: Ophthalmology

## 2020-02-20 DIAGNOSIS — H4710 Unspecified papilledema: Secondary | ICD-10-CM

## 2020-02-28 ENCOUNTER — Encounter: Payer: Self-pay | Admitting: Neurology

## 2020-02-28 ENCOUNTER — Ambulatory Visit (INDEPENDENT_AMBULATORY_CARE_PROVIDER_SITE_OTHER): Payer: 59 | Admitting: Neurology

## 2020-02-28 VITALS — BP 105/72 | HR 83 | Ht 66.0 in | Wt 284.0 lb

## 2020-02-28 DIAGNOSIS — H4710 Unspecified papilledema: Secondary | ICD-10-CM

## 2020-02-28 DIAGNOSIS — G932 Benign intracranial hypertension: Secondary | ICD-10-CM | POA: Diagnosis not present

## 2020-02-28 MED ORDER — TOPIRAMATE 50 MG PO TABS: 50.0000 mg | ORAL_TABLET | Freq: Two times a day (BID) | ORAL | 3 refills | Status: DC

## 2020-02-28 MED ORDER — ACETAZOLAMIDE 125 MG PO TABS: 250.0000 mg | ORAL_TABLET | Freq: Two times a day (BID) | ORAL | 0 refills | Status: DC

## 2020-02-28 NOTE — Patient Instructions (Addendum)
I had a long discussion with the patient and her mother regarding her symptoms of headaches and blurred vision and bilateral papilledema and MRI findings suggesting pseudotumor cerebri.  I recommend she continue Diamox  250 mg twice daily as well as start Topamax 50 mg twice daily to decrease CSF production.  I have counseled her to lose weight and go on a strict diet.  Continue follow-up with her ophthalmologist regularly to look for resolution of papilledema and enlargement of blind spot.  Return for follow-up in the future in 3 months or call earlier if necessary.  Idiopathic Intracranial Hypertension  Idiopathic intracranial hypertension (IIH) is a condition that increases pressure around the brain. The fluid that surrounds the brain and spinal cord (cerebrospinal fluid, CSF) increases and causes the pressure. Idiopathic means that the cause of this condition is not known. IIH affects the brain and spinal cord (is a neurological disorder). If this condition is not treated, it can cause vision loss or blindness. What increases the risk? You are more likely to develop this condition if:  You are severely overweight (obese).  You are a woman who has not gone through menopause.  You take certain medicines, such as birth control or steroids. What are the signs or symptoms? Symptoms of IIH include:  Headaches. This is the most common symptom.  Pain in the shoulders or neck.  Nausea and vomiting.  A "rushing water" or pulsing sound within the ears (pulsatile tinnitus).  Double vision.  Blurred vision.  Brief episodes of complete vision loss. How is this diagnosed? This condition may be diagnosed based on:  Your symptoms.  Your medical history.  CT scan of the brain.  MRI of the brain.  Magnetic resonance venogram (MRV) to check veins in the brain.  Diagnostic lumbar puncture. This is a procedure to remove and examine a sample of cerebrospinal fluid. This procedure can  determine whether too much fluid may be causing IIH.  A thorough eye exam to check for swelling or nerve damage in the eyes. How is this treated? Treatment for this condition depends on your symptoms. The goal of treatment is to decrease the pressure around your brain. Common treatments include:  Medicines to decrease the production of spinal fluid and lower the pressure within your skull.  Medicines to prevent or treat headaches.  Surgery to place drains (shunts) in your brain to remove excess fluid.  Lumbar puncture to remove excess cerebrospinal fluid. Follow these instructions at home:  If you are overweight or obese, work with your health care provider to lose weight.  Take over-the-counter and prescription medicines only as told by your health care provider.  Do not drive or use heavy machinery while taking medicines that can make you sleepy.  Keep all follow-up visits as told by your health care provider. This is important. Contact a health care provider if:  You have changes in your vision, such as: ? Double vision. ? Not being able to see colors (color vision). Get help right away if:  You have any of the following symptoms and they get worse or do not get better. ? Headaches. ? Nausea. ? Vomiting. ? Vision changes or difficulty seeing. Summary  Idiopathic intracranial hypertension (IIH) is a condition that increases pressure around the brain. The cause is not known (is idiopathic).  The most common symptom of IIH is headaches.  Treatment may include medicines or surgery to relieve the pressure on your brain. This information is not intended to replace advice  given to you by your health care provider. Make sure you discuss any questions you have with your health care provider. Document Revised: 07/16/2017 Document Reviewed: 06/24/2016 Elsevier Patient Education  2020 [employer].

## 2020-02-28 NOTE — Progress Notes (Signed)
[facility] [address] [place]. [place] [address] [phone]       OFFICE CONSULT NOTE  Alicia Swanson Date of Birth:  05-Jan-1996 Medical Record Number:  [id number]   Referring MD: Knox Royalty  Reason for Referral: Pseudotumor cerebri  HPI: Ms. written however is a 24 year old African-American lady was seen today for initial office consultation visit.  She is accompanied by her mother.  History is obtained from them and review of electronic medical records and I have reviewed available imaging films in PACS.  She has past medical history of developmental delay, morbid obesity, obstructive sleep apnea, remote childhood seizures, lymphedema in both lower extremities and immune deficiency.  She started complaining of headaches for the last couple of months which are often known and severe and patient had to take Tylenol and Motrin quite frequently.  She also complained of blurred vision about a week ago patient was seen at [facility] by Dr. Georges Mouse.  Unfortunately I do not have his records but apparently she was diagnosed with bilateral papilledema with optic nerve pressure which was very high and difficult to measure.  Patient had an outpatient MRI scheduled but she developed nausea vomiting increasing headaches and instead went to the ER at [facility] on 02/09/2020 when she underwent MRI scan of the brain which showed partially empty sella and questionable T2 hyperintense intensity of the posterior intraorbital optic nerves but no other definite abnormalities.  MRV of the brain and orbits showed no evidence of dural venous thrombosis.  She subsequently had diagnostic spinal tap performed by radiology which showed elevated CSF opening pressure of 38 cm.  5 cc of spinal fluid was sent for testing which showed normal protein of 22 mg percent and CSF glucose of 66 mg percent.  There was 0 white cells and 170 red cells.  Patient states that her headaches  have improved since then she has had only headaches once or twice a week and Tylenol helps.  She was started on Diamox to 50 mg twice daily which she is tolerating well without any paresthesias or other side effects.  She is also started watching her diet and has lost some weight.  She also has bilateral lower extremity swellings and has been diagnosed with chronic lymphedema which she has had for years.  She also apparently has recurrent sinus infections and was diagnosed with immune deficiency.  She has history of developmental delay since birth and has not gone to school.  She used to work at [employer] but after [employer] she stopped going there.  She lives at home with her mother.  She can do most activities for daily living by herself.  She also has history of remote childhood seizures and was on Depakote for several years but she has been seizure-free for more than 15 years off anticonvulsants now.  ROS:   14 system review of systems is positive for headache, blurred vision, tooth pain, dental abscess and all other systems negative PMH:  Past Medical History:  Diagnosis Date  . Anemia   . Asthma   . Headache(784.0)   . IIH (idiopathic intracranial hypertension)   . Learning difficulty involving reading   . Lymphedema of both lower extremities    [facility]  . OSA (obstructive sleep apnea)   . Seizures (HCC)    hasn't had a known seizure since about 2013 per pt's guardian     Social History:  Social History   Socioeconomic History  .  Marital status: Single    Spouse name: Not on file  . Number of children: 0  . Years of education: Not on file  . Highest education level: Not on file  Occupational History  . Occupation: Dentist: STUDENT  Tobacco Use  . Smoking status: Never Smoker  . Smokeless tobacco: Never Used  Vaping Use  . Vaping Use: Never used  Substance and Sexual Activity  . Alcohol use: No  . Drug use: No  . Sexual activity: Never    Comment: will  be starting birth control 03/2020 for cycle management  Other Topics Concern  . Not on file  Social History Narrative   Work or School: [employer], senior      Home Situation: lives with mother      Spiritual Beliefs: christian      Lifestyle: no regular exercise; diet is poor         Caffeine: soda, 2-3 glasses a week maybe    Social Determinants of Health   Financial Resource Strain:   . Difficulty of Paying Living Expenses:   Food Insecurity:   . Worried About Programme researcher, broadcasting/film/video in the Last Year:   . Barista in the Last Year:   Transportation Needs:   . Freight forwarder (Medical):   Marland Kitchen Lack of Transportation (Non-Medical):   Physical Activity:   . Days of Exercise per Week:   . Minutes of Exercise per Session:   Stress:   . Feeling of Stress :   Social Connections:   . Frequency of Communication with Friends and Family:   . Frequency of Social Gatherings with Friends and Family:   . Attends Religious Services:   . Active Member of Clubs or Organizations:   . Attends Banker Meetings:   Marland Kitchen Marital Status:   Intimate Partner Violence:   . Fear of Current or Ex-Partner:   . Emotionally Abused:   Marland Kitchen Physically Abused:   . Sexually Abused:     Medications:   Current Outpatient Medications on File Prior to Visit  Medication Sig Dispense Refill  . acetaminophen (TYLENOL) 325 MG tablet Take 975 mg by mouth every 6 (six) hours as needed for moderate pain.    . ferrous sulfate 325 (65 FE) MG tablet Take 1 tablet (325 mg total) by mouth 2 (two) times daily with a meal. 60 tablet 0  . senna-docusate (SENOKOT-S) 8.6-50 MG tablet Take 1 tablet by mouth 2 (two) times daily. 60 tablet 0  . Vitamin D, Ergocalciferol, (DRISDOL) 1.25 MG (50000 UNIT) CAPS capsule Take 50,000 Units by mouth every 7 (seven) days.     No current facility-administered medications on file prior to visit.    Allergies:   Allergies  Allergen Reactions  . Penicillins  Rash        Physical Exam General: Morbidly obese young African-American lady seated, in no evident distress.  Mild proptosis Head: head normocephalic and atraumatic.   Neck: supple with no carotid or supraclavicular bruits Cardiovascular: regular rate and rhythm, no murmurs Musculoskeletal: no deformity Skin:  no rash/petichiae 2+ edema lower extremities. Vascular: Pulses difficult to feel in extremities.   Neurologic Exam Mental Status: Awake and fully alert. Oriented to place and time. Recent and remote memory intact. Attention span, concentration and fund of knowledge appropriate. Mood and affect appropriate.  Cranial Nerves: Fundoscopic exam difficult to visualize disc through undilated pupils.. Pupils equal, briskly reactive to light. Extraocular movements full  without nystagmus. Visual fields full to confrontation. Hearing intact. Facial sensation intact. Face, tongue, palate moves normally and symmetrically.  Motor: Normal bulk and tone. Normal strength in all tested extremity muscles. Sensory.: intact to touch , pinprick , position and vibratory sensation.  Coordination: Rapid alternating movements normal in all extremities. Finger-to-nose and heel-to-shin performed accurately bilaterally. Gait and Station: Arises from chair without difficulty. Stance is normal. Gait demonstrates normal stride length and balance . Able to heel, toe and tandem walk with moderate difficulty.  Reflexes: 1+ and symmetric. Toes downgoing.       ASSESSMENT: 25 year old African-American lady with 1 month history of intermittent headaches and blurred vision with bilateral papilledema likely due to pseudotumor cerebri.  She has shown response to spinal tap and starting Diamox.  History of developmental delay with remote history of childhood seizures now stable off antiepileptics.  Marland Kitchen    PLAN:  I had a long discussion with the patient and her mother regarding her symptoms of headaches and blurred vision  and bilateral papilledema and MRI findings suggesting pseudotumor cerebri.  I recommend she continue Diamox to 50 mg twice daily as well as start Topamax 50 mg twice daily to decrease CSF production.  I have counseled her to lose weight and go on a strict diet.  Continue follow-up with her ophthalmologist regularly to look for resolution of papilledema and enlargement of blind spot.  Greater than 50% time during this 45-minute consultation was it was spent on counseling and coordination of care about her headaches and papilledema and pseudotumor and answering questions return for follow-up in the future in 3 months or call earlier if necessary. Delia Heady, MD  [facility] [address] [place], [place] [address]  Phone [phone] Fax [phone] Note: This document was prepared with digital dictation and possible smart phrase technology. Any transcriptional errors that result from this process are unintentional.

## 2020-03-18 ENCOUNTER — Telehealth: Payer: Self-pay | Admitting: Neurology

## 2020-03-18 NOTE — Telephone Encounter (Signed)
Stanton Kidney- did you get any forms on this pt (Dr. Pearlean Brownie pt)

## 2020-03-18 NOTE — Telephone Encounter (Signed)
Pt's mother called stating that the pt's Oral Surgeon has faxed over some forms to be filled out for the pt to be cleared for surgery. Pt is needing this done soon due to abscess and swollen face and they are needing to have this clearance asap. Please call mother back and fax clearance.

## 2020-03-18 NOTE — Telephone Encounter (Signed)
Received clearance form. Dr. Epimenio Foot reviewed and completed. Cleared pt for procedure. Faxed back signed form with last OV notes from Dr. Pearlean Brownie at [phone]. Received fax confirmation. I called mother and informed her clearance form sent back/completed.  She verbalized understanding and appreciation.  Mother states eye doctor gave her report (4 pages) She will bring report for Dr. Pearlean Brownie to review. Aware he is off this week. Will be back next week.

## 2020-03-19 ENCOUNTER — Other Ambulatory Visit: Payer: Self-pay | Admitting: Ophthalmology

## 2020-03-21 ENCOUNTER — Ambulatory Visit
Admission: RE | Admit: 2020-03-21 | Discharge: 2020-03-21 | Disposition: A | Discharge status: Home / Self Care | Payer: 59 | Source: Ambulatory Visit | Attending: Ophthalmology | Admitting: Ophthalmology

## 2020-03-21 ENCOUNTER — Other Ambulatory Visit: Payer: Self-pay

## 2020-03-21 DIAGNOSIS — H4710 Unspecified papilledema: Secondary | ICD-10-CM

## 2020-03-21 IMAGING — MR MR HEAD WO/W CM
10 series · 48 of 48 positions shown · IV contrast (20 ml multihance)
Comparison: None.

CLINICAL DATA: Papilledema

EXAM:
MRI HEAD AND ORBITS WITHOUT AND WITH CONTRAST
TECHNIQUE: Multiplanar, multiecho pulse sequences of the brain and surrounding
structures were obtained without and with intravenous contrast.
Multiplanar, multiecho pulse sequences of the orbits and surrounding
structures were obtained including fat saturation techniques, before
and after intravenous contrast administration.
CONTRAST:  20mL MULTIHANCE GADOBENATE DIMEGLUMINE 529 MG/ML IV SOLN

[Series 5: T1 · sagittal · 4.0mm · 0.75mm/px · 2 of 31 slices shown (1 of 3)]
[im 1/31]
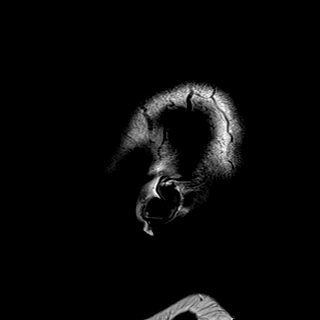
[im 31/31]
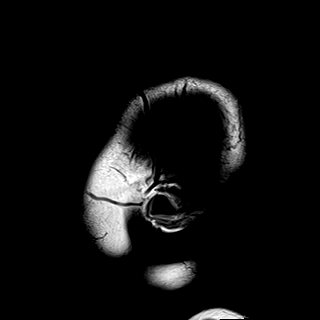

[Series 6: DWI · axial · 3.0mm · 1.44mm/px · z∈[-40,+95]mm · 6 of 84 slices shown (1 of 2)]
[im 1/84]
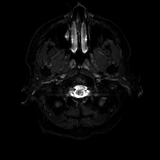
[im 17/84]
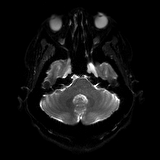
[im 34/84]
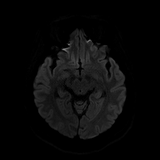
[im 50/84]
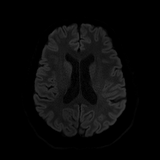
[im 67/84]
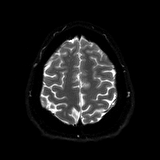
[im 84/84]
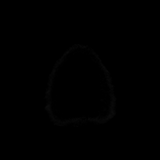

[Series 7: DWI · axial · 3.0mm · 1.44mm/px · z∈[-40,+95]mm · 3 of 42 slices shown (2 of 2)]
[im 1/42]
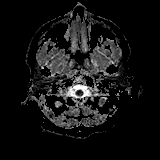
[im 21/42]
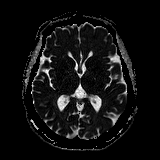
[im 42/42]
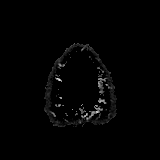

[Series 8: T2 · axial · 4.0mm · 0.36mm/px · z∈[-40,+95]mm · 2 of 27 slices shown]
[im 1/27]
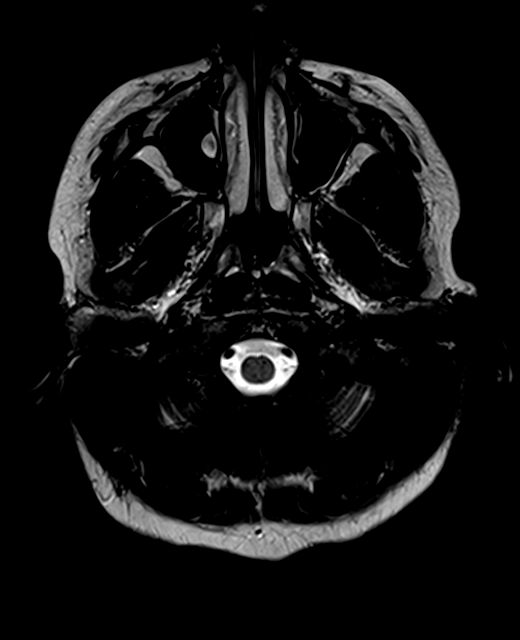
[im 27/27]
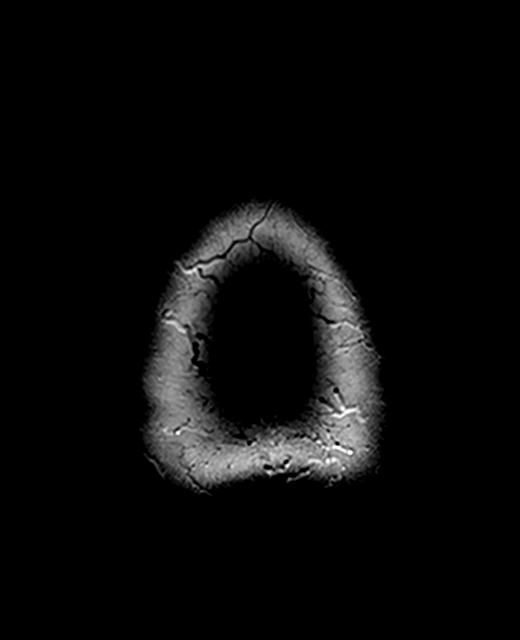

[Series 9: FLAIR · axial · 3.0mm · 0.72mm/px · z∈[-46,+104]mm · 2 of 26 slices shown]
[im 1/26]
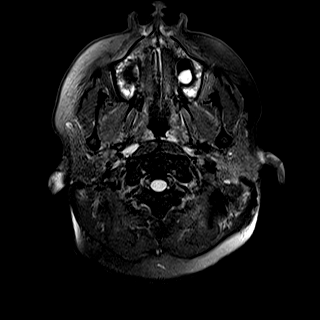
[im 26/26]
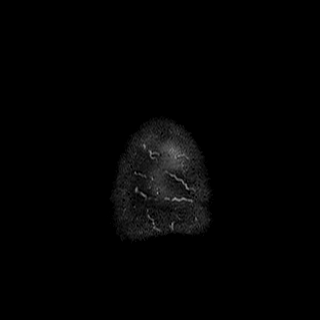

[Series 11: swi_images · axial · 2.2mm · 0.90mm/px · z∈[-42,+97]mm · 5 of 64 slices shown]
[im 1/64]
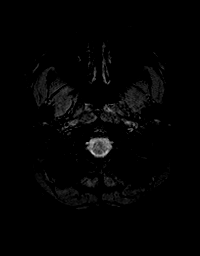
[im 16/64]
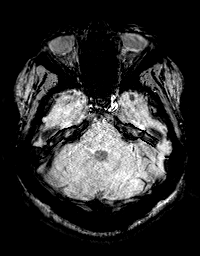
[im 32/64]
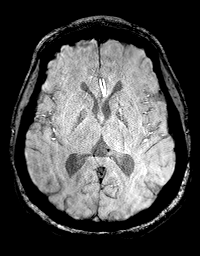
[im 48/64]
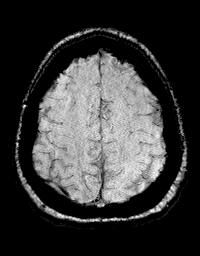
[im 64/64]
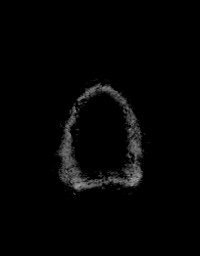

[Series 12: T1 · axial · 1.0mm · 0.94mm/px · z∈[-51,+107]mm · 12 of 160 slices shown (2 of 3)]
[im 1/160]
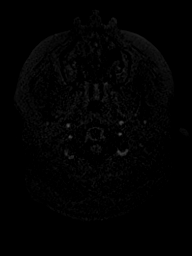
[im 15/160]
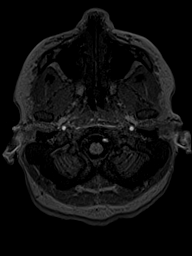
[im 29/160]
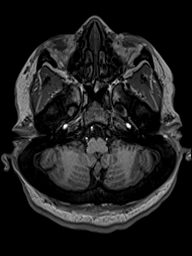
[im 44/160]
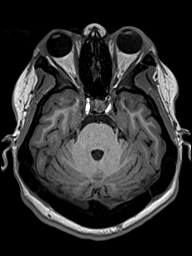
[im 58/160]
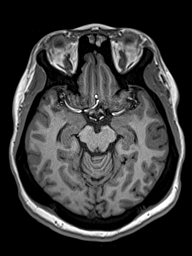
[im 73/160]
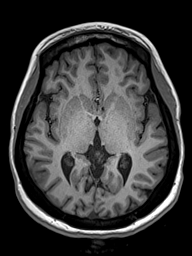
[im 87/160]
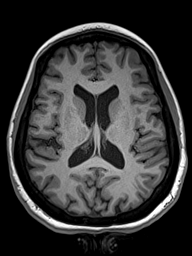
[im 102/160]
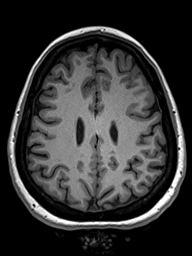
[im 116/160]
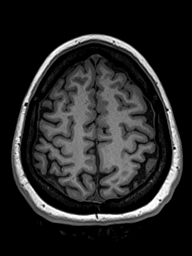
[im 131/160]
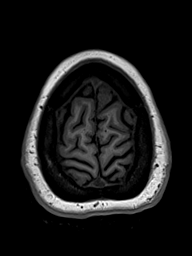
[im 145/160]
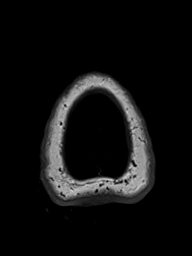
[im 160/160]
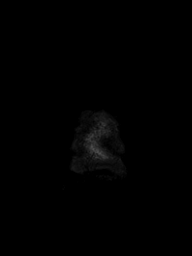

[Series 13: T2 post-contrast · coronal · 4.0mm · 0.36mm/px · 2 of 32 slices shown]
[im 1/32]
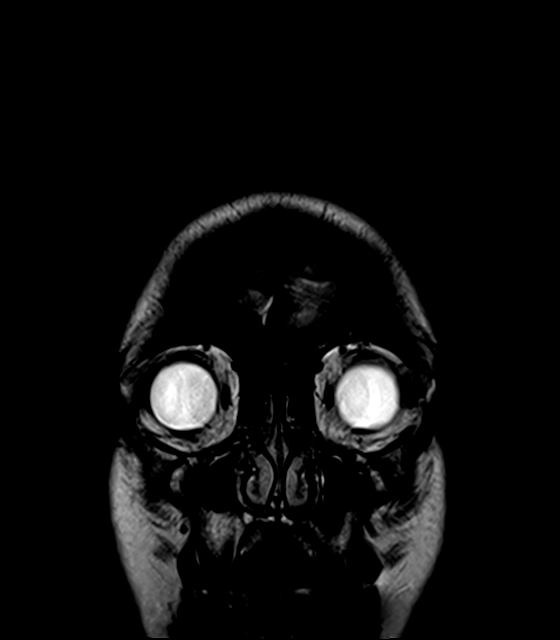
[im 32/32]
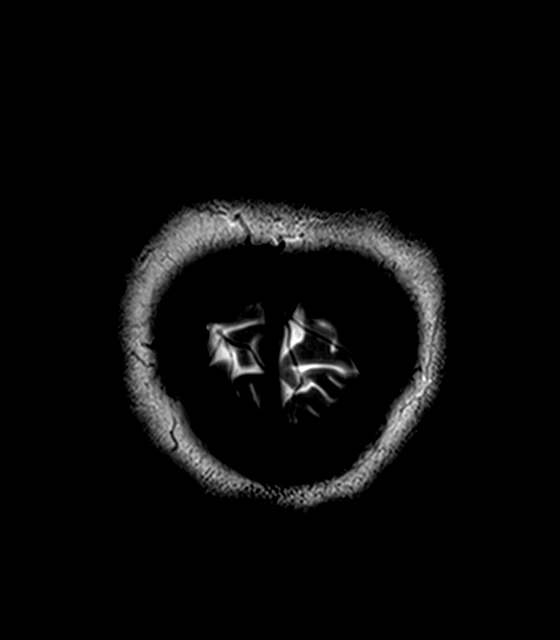

[Series 14: T1 · axial · 1.0mm · 0.94mm/px · z∈[-51,+107]mm · 12 of 160 slices shown (3 of 3)]
[im 1/160]
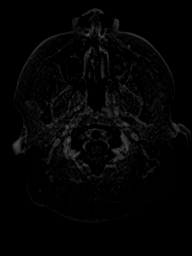
[im 15/160]
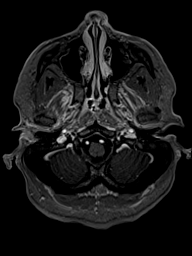
[im 29/160]
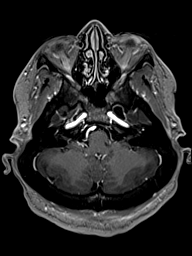
[im 44/160]
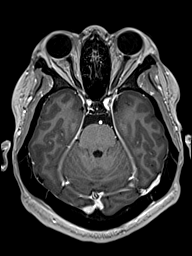
[im 58/160]
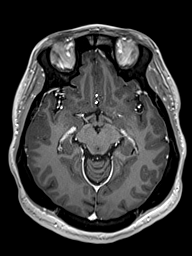
[im 73/160]
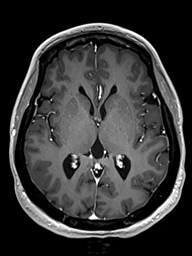
[im 87/160]
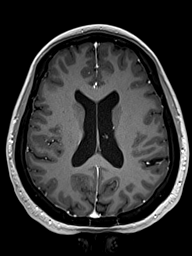
[im 102/160]
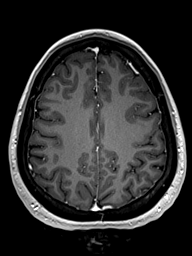
[im 116/160]
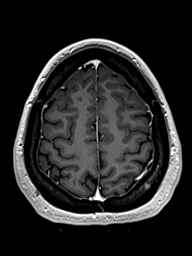
[im 131/160]
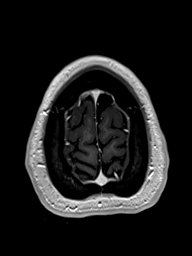
[im 145/160]
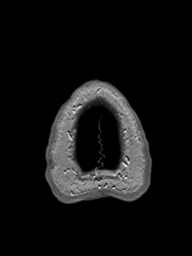
[im 160/160]
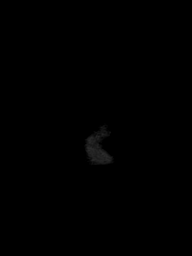

[Series 15: T1 post-contrast · coronal · 4.0mm · 0.72mm/px · 2 of 32 slices shown]
[im 1/32]
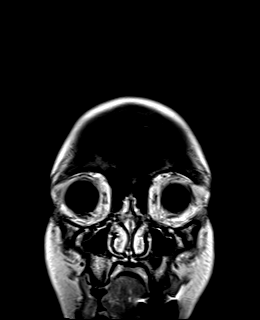
[im 32/32]
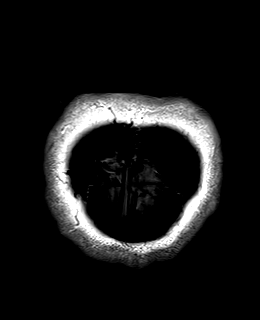

[48 of 48 positions shown; findings below may reference images not displayed]

FINDINGS: MRI HEAD FINDINGS

BRAIN: No acute infarct, acute hemorrhage or extra-axial collection.
Normal white matter signal. Normal volume of CSF spaces. No chronic
microhemorrhage. Normal midline structures. There is no abnormal
contrast enhancement.

VASCULAR: Major flow voids are preserved.

SKULL AND UPPER CERVICAL SPINE: Normal calvarium and skull base.
Visualized upper cervical spine and soft tissues are normal.

MRI ORBITS FINDINGS

Orbits: No traumatic or inflammatory finding. Globes, optic nerves,
orbital fat, extraocular muscles, vascular structures, and lacrimal
glands are normal.

Visualized sinuses: Clear.

Soft tissues: Negative.
IMPRESSION: Normal MRI of the brain and orbits.

## 2020-03-21 IMAGING — MR MR ORBITS WO/W CM
4 of 6 series · 13 of 48 positions shown · IV contrast (20 ml multihance)
Comparison: None.

CLINICAL DATA: Papilledema

EXAM:
MRI HEAD AND ORBITS WITHOUT AND WITH CONTRAST
TECHNIQUE: Multiplanar, multiecho pulse sequences of the brain and surrounding
structures were obtained without and with intravenous contrast.
Multiplanar, multiecho pulse sequences of the orbits and surrounding
structures were obtained including fat saturation techniques, before
and after intravenous contrast administration.
CONTRAST:  20mL MULTIHANCE GADOBENATE DIMEGLUMINE 529 MG/ML IV SOLN

[Series 1: T2 fat-sat · coronal · 3.0mm · 0.25mm/px · 3 of 28 slices shown (1 of 2)]
[im 4/28]
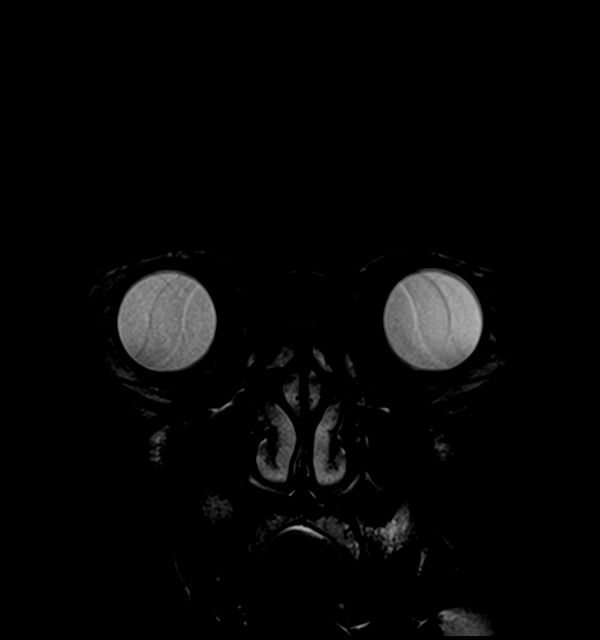
[im 16/28]
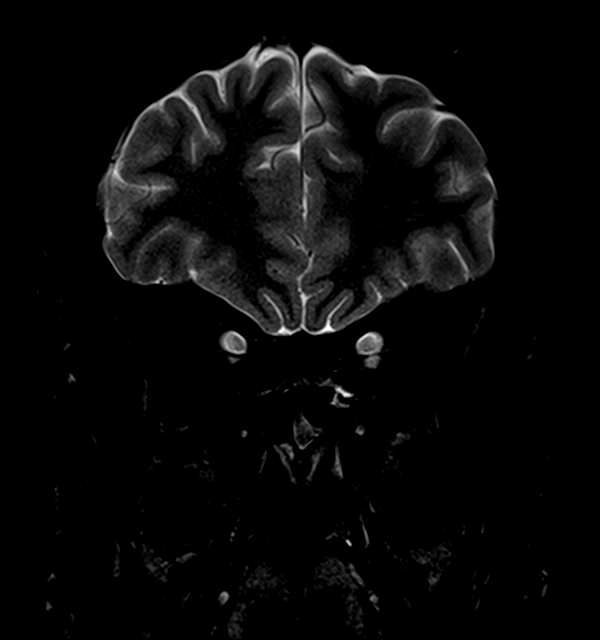
[im 25/28]
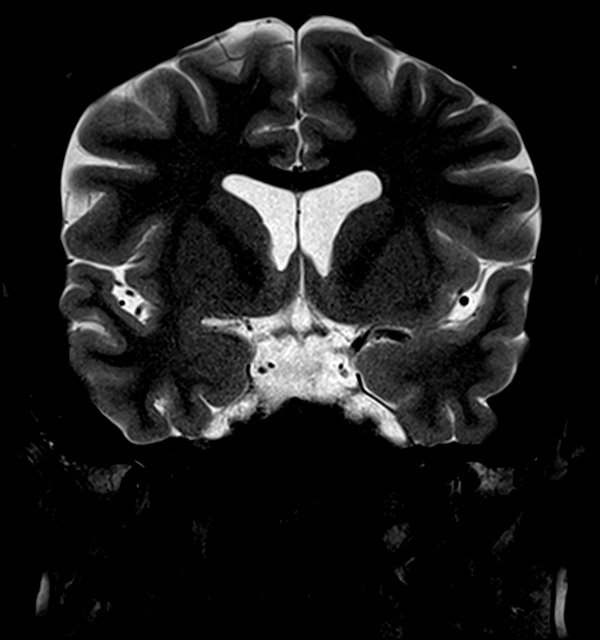

[Series 2: T2 fat-sat · axial · 3.0mm · 0.25mm/px · z∈[-22,+18]mm · 3 of 17 slices shown (2 of 2)]
[im 4/17]
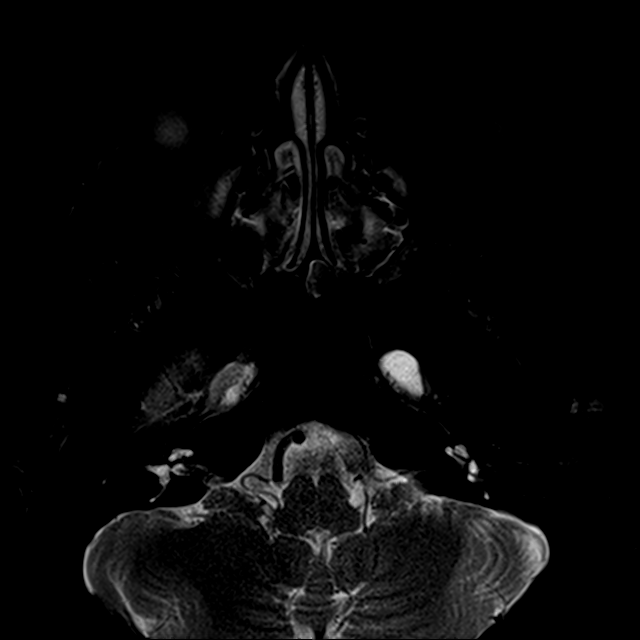
[im 10/17]
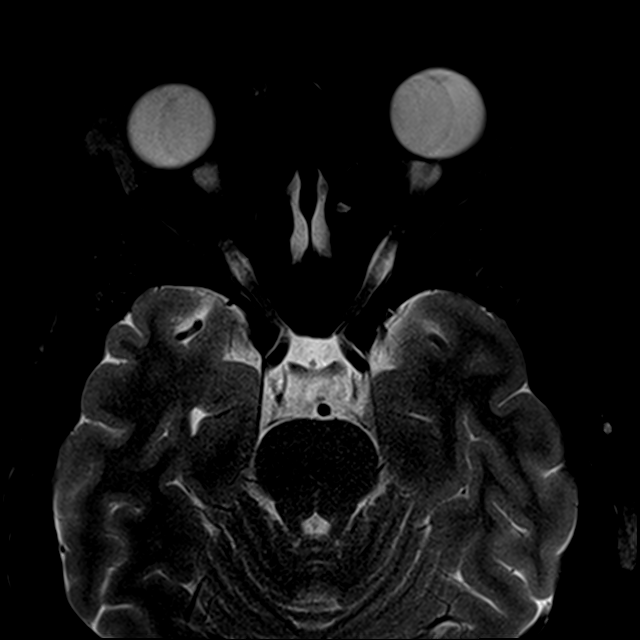
[im 17/17]
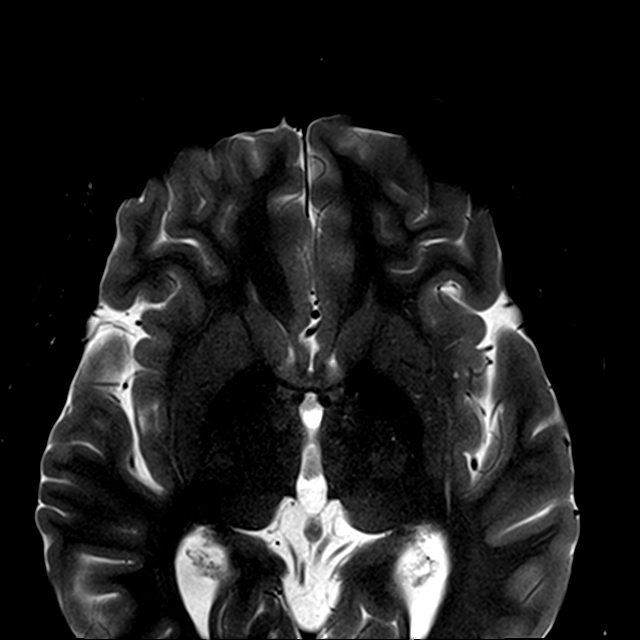

[Series 3: T1 · axial · 3.0mm · 0.25mm/px · z∈[-22,+18]mm · 3 of 17 slices shown]
[im 4/17]
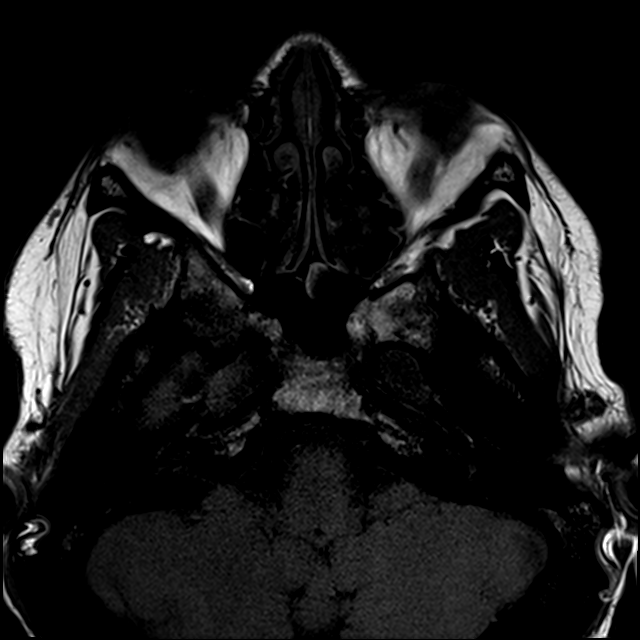
[im 10/17]
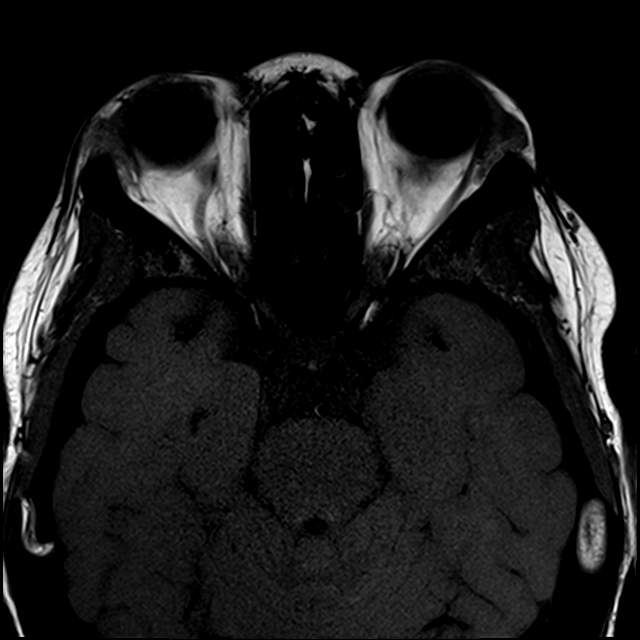
[im 17/17]
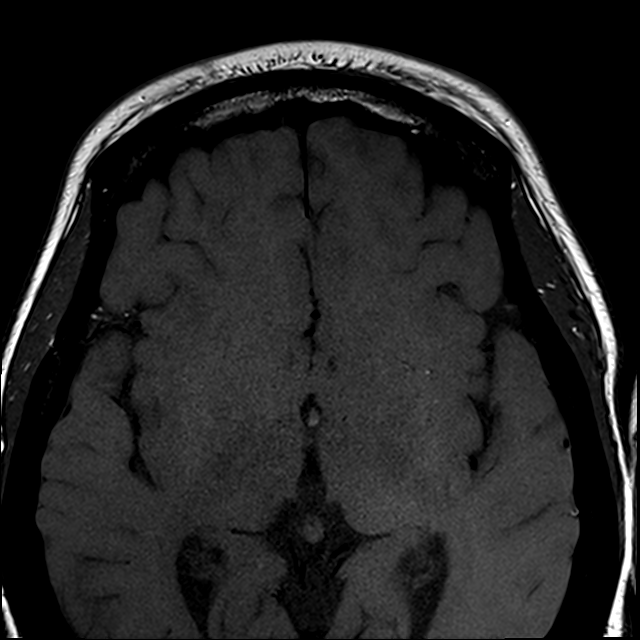

[Series 6: T1 post-contrast · coronal · 3.0mm · 0.25mm/px · 4 of 28 slices shown]
[im 1/28]
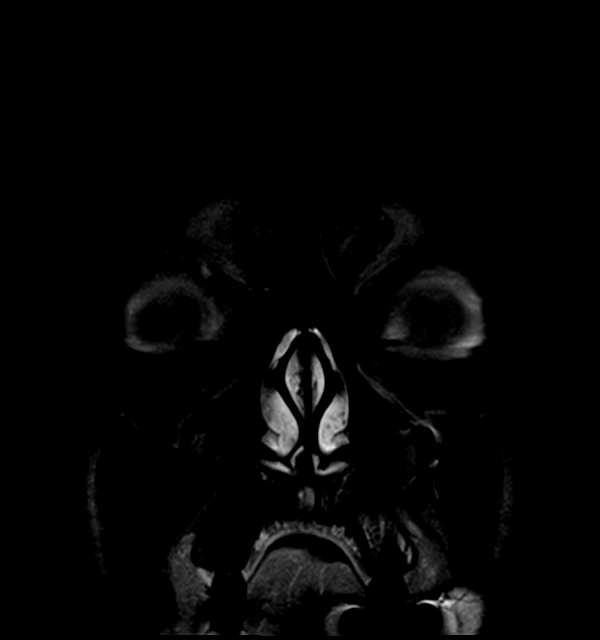
[im 4/28]
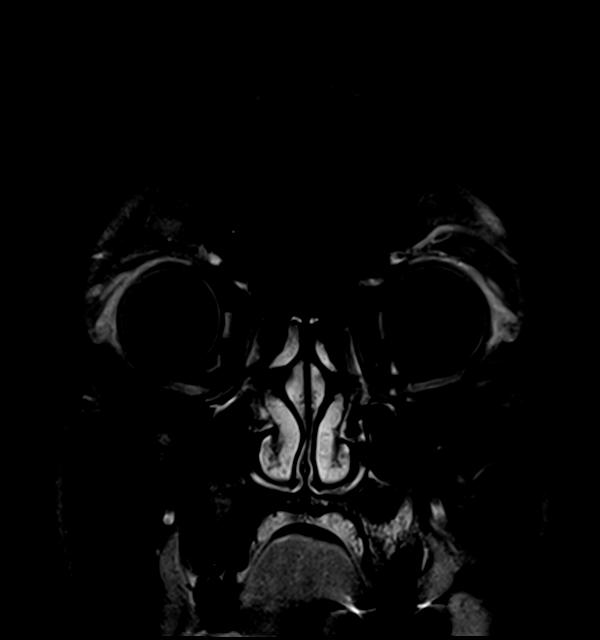
[im 16/28]
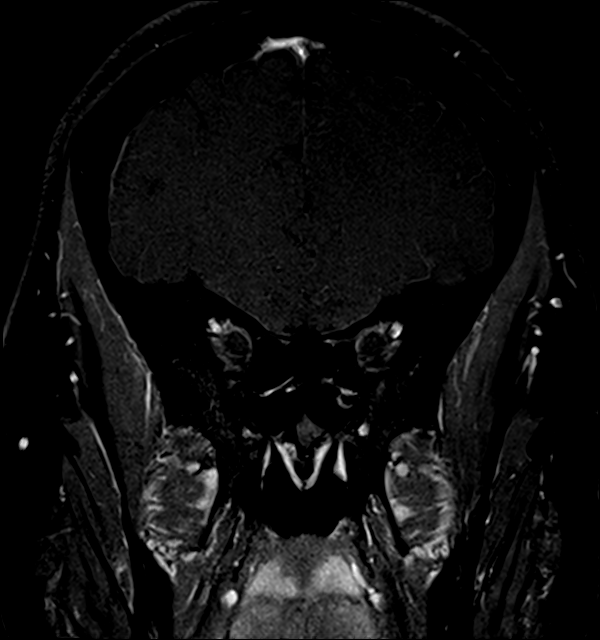
[im 25/28]
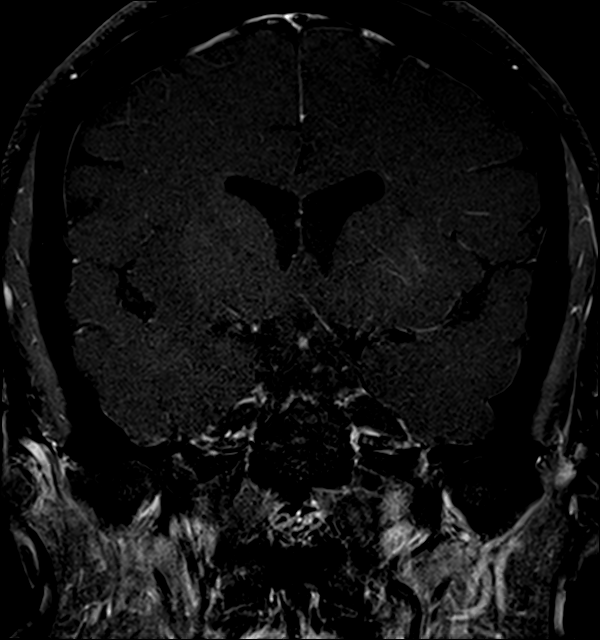

[13 of 48 positions shown; findings below may reference images not displayed]

FINDINGS: MRI HEAD FINDINGS

BRAIN: No acute infarct, acute hemorrhage or extra-axial collection.
Normal white matter signal. Normal volume of CSF spaces. No chronic
microhemorrhage. Normal midline structures. There is no abnormal
contrast enhancement.

VASCULAR: Major flow voids are preserved.

SKULL AND UPPER CERVICAL SPINE: Normal calvarium and skull base.
Visualized upper cervical spine and soft tissues are normal.

MRI ORBITS FINDINGS

Orbits: No traumatic or inflammatory finding. Globes, optic nerves,
orbital fat, extraocular muscles, vascular structures, and lacrimal
glands are normal.

Visualized sinuses: Clear.

Soft tissues: Negative.
IMPRESSION: Normal MRI of the brain and orbits.

## 2020-03-21 MED ORDER — GADOBENATE DIMEGLUMINE 529 MG/ML IV SOLN
20.0000 mL | Freq: Once | INTRAVENOUS | Status: AC | PRN
  Administered 2020-03-21: 20 mL via INTRAVENOUS

## 2020-04-03 ENCOUNTER — Encounter: Payer: Self-pay | Admitting: Adult Health

## 2020-04-03 ENCOUNTER — Other Ambulatory Visit: Payer: Self-pay

## 2020-04-03 ENCOUNTER — Ambulatory Visit (INDEPENDENT_AMBULATORY_CARE_PROVIDER_SITE_OTHER): Payer: 59 | Admitting: Adult Health

## 2020-04-03 VITALS — BP 96/62 | HR 76 | Temp 97.6°F | Ht 67.0 in | Wt 284.4 lb

## 2020-04-03 DIAGNOSIS — G4733 Obstructive sleep apnea (adult) (pediatric): Secondary | ICD-10-CM | POA: Diagnosis not present

## 2020-04-03 DIAGNOSIS — Z01818 Encounter for other preprocedural examination: Secondary | ICD-10-CM

## 2020-04-03 NOTE — Progress Notes (Signed)
@Patient  ID: Alicia Swanson, female    DOB: [birth year], 24 y.o.   MRN: [id number]  Chief Complaint  Patient presents with  . Follow-up    Referring provider: [id number], MD  HPI: 24 year old female never smoker followed for severe obstructive sleep apnea  TEST/EVENTS :  12/2008 NPSG >>severe OSA, with AHI of 75 events per hour including 33 central apneas, lowest desaturation was 60%. 06/2014 Titration study >>CPAP 15 cm  04/03/2020 Follow up : OSA  Patient returns for a follow-up visit.  Last seen in August 2019.  Patient is supposed to be on nocturnal CPAP for her underlying severe sleep apnea.  Patient is accompanied by her mom who helps with her medical care.  Patient is supposed to be using CPAP at bedtime.  However patient says that it is not very comfortable and has not been wearing it on a routine basis.  She is had a history of noncompliance.  Patient education on sleep apnea potential complications of untreated sleep apnea.  Also went over potential helpful hints to help with the CPAP mask.  We looked at several different mask and patient would like to try the DreamWear nasal mask.  Patient also needs pulmonary preop clearance for tooth abscess as she has been in need dental surgery.  She has been treated by oral surgeon for tooth abscess and is planning on a removal of 2 teeth.       Immunization History  Administered Date(s) Administered  . Influenza,inj,Quad PF,6+ Mos 05/26/2016    Past Medical History:  Diagnosis Date  . Anemia   . Asthma   . Headache(784.0)   . IIH (idiopathic intracranial hypertension)   . Learning difficulty involving reading   . Lymphedema of both lower extremities    [facility]  . OSA (obstructive sleep apnea)   . Seizures (HCC)    hasn't had a known seizure since about 2013 per pt's guardian     Tobacco History: Social History   Tobacco Use  Smoking Status Never Smoker  Smokeless Tobacco Never Used   Counseling given:  Not Answered   Outpatient Medications Prior to Visit  Medication Sig Dispense Refill  . acetaminophen (TYLENOL) 325 MG tablet Take 975 mg by mouth every 6 (six) hours as needed for moderate pain.    . ferrous sulfate 325 (65 FE) MG tablet Take 1 tablet (325 mg total) by mouth 2 (two) times daily with a meal. 60 tablet 0  . senna-docusate (SENOKOT-S) 8.6-50 MG tablet Take 1 tablet by mouth 2 (two) times daily. 60 tablet 0  . topiramate (TOPAMAX) 50 MG tablet Take 1 tablet (50 mg total) by mouth 2 (two) times daily. 60 tablet 3  . Vitamin D, Ergocalciferol, (DRISDOL) 1.25 MG (50000 UNIT) CAPS capsule Take 50,000 Units by mouth every 7 (seven) days.    11-[birth year]1978 acetaZOLAMIDE (DIAMOX) 125 MG tablet Take 2 tablets (250 mg total) by mouth 2 (two) times daily. 120 tablet 0   No facility-administered medications prior to visit.     Review of Systems:   Constitutional:   No  weight loss, night sweats,  Fevers, chills, fatigue, or  lassitude.  HEENT:   No headaches,  Difficulty swallowing,   or  Sore throat,                No sneezing, itching, ear ache, nasal congestion, post nasal drip,   CV:  No chest pain,  Orthopnea, PND, swelling in lower extremities, anasarca, dizziness, palpitations, syncope.  GI  No heartburn, indigestion, abdominal pain, nausea, vomiting, diarrhea, change in bowel habits, loss of appetite, bloody stools.   Resp: No shortness of breath with exertion or at rest.  No excess mucus, no productive cough,  No non-productive cough,  No coughing up of blood.  No change in color of mucus.  No wheezing.  No chest wall deformity  Skin: no rash or lesions.  GU: no dysuria, change in color of urine, no urgency or frequency.  No flank pain, no hematuria   MS:  No joint pain or swelling.  No decreased range of motion.  No back pain.    Physical Exam  BP 96/62 (BP Location: Left Arm, Cuff Size: Normal)   Pulse 76   Temp 97.6 F (36.4 C) (Temporal)   Ht 5\' 7"  (1.702 m)   Wt 284  lb 6.4 oz (129 kg)   SpO2 100% Comment: RA  BMI 44.54 kg/m   GEN: A/Ox3; pleasant , NAD, BMI 44   HEENT:  [place]/AT,    NOSE-clear, THROAT-clear, no lesions, no postnasal drip or exudate noted.  Class III MP airway  NECK:  Supple w/ fair ROM; no JVD; normal carotid impulses w/o bruits; no thyromegaly or nodules palpated; no lymphadenopathy.    RESP  Clear  P & A; w/o, wheezes/ rales/ or rhonchi. no accessory muscle use, no dullness to percussion  CARD:  RRR, no m/r/g, no peripheral edema, pulses intact, no cyanosis or clubbing.  GI:   Soft & nt; nml bowel sounds; no organomegaly or masses detected.   Musco: Warm bil, no deformities or joint swelling noted.   Neuro: alert, no focal deficits noted.    Skin: Warm, no lesions or rashes    Lab Results:   BNP No results found for: BNP  ProBNP No results found for: PROBNP  Imaging:     No flowsheet data found.  No results found for: NITRICOXIDE      Assessment & Plan:   No problem-specific Assessment & Plan notes found for this encounter.     , NP 04/03/2020

## 2020-04-03 NOTE — Patient Instructions (Addendum)
Set up Mask fitting . Change to Dream wear nasal . Order for supplies to DME  Need to wear  CPAP At bedtime  For at least 4hrs .  Work on healthy weight.  Do not drive if sleepy.  Good luck on upcoming dental surgery will need to be done in hospital setting.  Follow up with Dr. Melida Quitter in 3 months and As needed

## 2020-04-04 DIAGNOSIS — Z01818 Encounter for other preprocedural examination: Secondary | ICD-10-CM | POA: Insufficient documentation

## 2020-04-04 NOTE — Assessment & Plan Note (Signed)
Pulmonary preop clearance.  Patient is active and independent.  O2 saturations are 100% on room air.  Patient does have very severe sleep apnea which places her at a more risk for post op complications.  Would prefer patient have procedure done at hospital setting .  Would use CPAP postop if needed.  Recommend 1. Short duration of surgery as much as possible and avoid paralytic if possible Pulmonary consultation CPAP if needed.

## 2020-04-04 NOTE — Assessment & Plan Note (Signed)
Patient education on sleep apnea and CPAP usage Patient will change to DreamWear nasal mask.  Set up at the homecare company for a mask fitting.   Plan  Patient Instructions  Set up Mask fitting . Change to Dream wear nasal . Order for supplies to DME  Need to wear  CPAP At bedtime  For at least 4hrs .  Work on healthy weight.  Do not drive if sleepy.  Good luck on upcoming dental surgery will need to be done in hospital setting.  Follow up with Dr. Melida Quitter in 3 months and As needed

## 2020-04-05 ENCOUNTER — Telehealth: Payer: Self-pay | Admitting: Adult Health

## 2020-04-05 NOTE — Telephone Encounter (Signed)
Will fax copy of TP's office note. Will need to call on Monday to see if they have received the office notes, in which TP cleared the patient.

## 2020-04-05 NOTE — Telephone Encounter (Signed)
Located the fax form and it is on Tammy's desk for her to complete.  Attempted to call and make them aware, but the office was closed.  Will leave in triage until completed.

## 2020-04-09 ENCOUNTER — Telehealth: Payer: Self-pay | Admitting: Neurology

## 2020-04-09 NOTE — Telephone Encounter (Signed)
Pt's mother called wanting to speak to RN regarding the pts medications. She states that the pt's OBGYN is wanting to put the pt on Norethindrone .35mg  but they were informed that it will conflict with her topiramate (TOPAMAX) 50 MG tablet and they are wanting to be advised on what they can do.

## 2020-04-09 NOTE — Telephone Encounter (Signed)
Called mother and advised her of Dr [employer] message. She stated she will go ahead and start her on norethindrone. I advised she call for any problems, concerns. She will touch base again in 2 weeks when Dr Pearlean Brownie returns to office. She verbalized understanding, appreciation.

## 2020-04-09 NOTE — Telephone Encounter (Signed)
Ok to continue topirmate and norethindrone for now. Interaction may make norethnidrone slightly less effective. Further recs per Dr. Pearlean Brownie. -VRP

## 2020-04-16 NOTE — Telephone Encounter (Signed)
Called and spoke with pt's mother [name] letting her know that we had faxed pt's last OV to the oral surgery center but stated to her that I would call them to get more information and she verbalized understanding. Spoke with Deserie who stated that they had info from 8/18 that pulmonary clearance was needed but they did not have pt's OV from 8/19. Stated to Deserie that I would fax pt's OV to her and she verbalized understanding.  I have faxed pt's OV from 8/19 that she had with TP to provided fax number.  nothing further needed.

## 2020-04-16 NOTE — Telephone Encounter (Signed)
Patient mother called states the surgery center is needing a note stating that TP give medial clearance to have the oral surgery. - The fax number [phone], phone number - [phone]. Pt mother Alicia Swanson can be reached at [phone]

## 2020-04-17 ENCOUNTER — Ambulatory Visit: Payer: 59 | Admitting: Diagnostic Neuroimaging

## 2020-04-21 ENCOUNTER — Other Ambulatory Visit: Payer: Self-pay | Admitting: Neurology

## 2020-04-22 NOTE — Telephone Encounter (Signed)
thanks

## 2020-04-23 ENCOUNTER — Other Ambulatory Visit: Payer: Self-pay

## 2020-04-23 ENCOUNTER — Telehealth: Payer: Self-pay

## 2020-04-23 MED ORDER — ACETAZOLAMIDE 125 MG PO TABS: 250.0000 mg | ORAL_TABLET | Freq: Two times a day (BID) | ORAL | 1 refills | Status: DC

## 2020-04-23 NOTE — Telephone Encounter (Signed)
Refill sent to [facility].  

## 2020-04-23 NOTE — Telephone Encounter (Signed)
Patient needs a refill on her Acetazolamide 250mg  sent to [facility] today, her mother says that she's been out of this med for 3 days. She is aware that we were closed Friday and Monday.

## 2020-04-23 NOTE — Addendum Note (Signed)
Addended by: Bertram Savin on: 04/23/2020 04:05 PM   Modules accepted: Orders

## 2020-06-04 ENCOUNTER — Other Ambulatory Visit: Payer: Self-pay

## 2020-06-04 ENCOUNTER — Encounter: Payer: Self-pay | Admitting: Neurology

## 2020-06-04 ENCOUNTER — Ambulatory Visit (INDEPENDENT_AMBULATORY_CARE_PROVIDER_SITE_OTHER): Payer: 59 | Admitting: Neurology

## 2020-06-04 VITALS — BP 120/86 | HR 76 | Ht 67.0 in | Wt 269.4 lb

## 2020-06-04 DIAGNOSIS — G932 Benign intracranial hypertension: Secondary | ICD-10-CM

## 2020-06-04 NOTE — Patient Instructions (Signed)
I had a long discussion with the patient and her mother regarding her pseudotumor cerebri, headache and visual disturbance all of which appear to have improved on the current medication regime.  Recommend she continue Diamox 250 mg twice daily as well as Topamax 50 mg twice daily.  She was encouraged to eat healthy and be active and lose weight.  She was encouraged to keep upcoming appointment next week with ophthalmologist to do a detailed funduscopic exam.  She may stay on norethindrone for her perimenstrual bleeding and I informed her that Topamax and high doses may make it less efficient and she may need a higher dose.  She will return for follow-up in 3 months or call earlier if necessary.  Idiopathic Intracranial Hypertension  Idiopathic intracranial hypertension (IIH) is a condition that increases pressure around the brain. The fluid that surrounds the brain and spinal cord (cerebrospinal fluid, CSF) increases and causes the pressure. Idiopathic means that the cause of this condition is not known. IIH affects the brain and spinal cord (is a neurological disorder). If this condition is not treated, it can cause vision loss or blindness. What increases the risk? You are more likely to develop this condition if:  You are severely overweight (obese).  You are a woman who has not gone through menopause.  You take certain medicines, such as birth control or steroids. What are the signs or symptoms? Symptoms of IIH include:  Headaches. This is the most common symptom.  Pain in the shoulders or neck.  Nausea and vomiting.  A "rushing water" or pulsing sound within the ears (pulsatile tinnitus).  Double vision.  Blurred vision.  Brief episodes of complete vision loss. How is this diagnosed? This condition may be diagnosed based on:  Your symptoms.  Your medical history.  CT scan of the brain.  MRI of the brain.  Magnetic resonance venogram (MRV) to check veins in the  brain.  Diagnostic lumbar puncture. This is a procedure to remove and examine a sample of cerebrospinal fluid. This procedure can determine whether too much fluid may be causing IIH.  A thorough eye exam to check for swelling or nerve damage in the eyes. How is this treated? Treatment for this condition depends on your symptoms. The goal of treatment is to decrease the pressure around your brain. Common treatments include:  Medicines to decrease the production of spinal fluid and lower the pressure within your skull.  Medicines to prevent or treat headaches.  Surgery to place drains (shunts) in your brain to remove excess fluid.  Lumbar puncture to remove excess cerebrospinal fluid. Follow these instructions at home:  If you are overweight or obese, work with your health care provider to lose weight.  Take over-the-counter and prescription medicines only as told by your health care provider.  Do not drive or use heavy machinery while taking medicines that can make you sleepy.  Keep all follow-up visits as told by your health care provider. This is important. Contact a health care provider if:  You have changes in your vision, such as: ? Double vision. ? Not being able to see colors (color vision). Get help right away if:  You have any of the following symptoms and they get worse or do not get better. ? Headaches. ? Nausea. ? Vomiting. ? Vision changes or difficulty seeing. Summary  Idiopathic intracranial hypertension (IIH) is a condition that increases pressure around the brain. The cause is not known (is idiopathic).  The most common symptom of  IIH is headaches.  Treatment may include medicines or surgery to relieve the pressure on your brain. This information is not intended to replace advice given to you by your health care provider. Make sure you discuss any questions you have with your health care provider. Document Revised: 07/16/2017 Document Reviewed:  06/24/2016 Elsevier Patient Education  2020 [employer].

## 2020-06-04 NOTE — Progress Notes (Signed)
[facility] [address] [place]. [place] [address] [phone]       OFFICE FOLLOW UP VISITNOTE  Alicia Swanson Date of Birth:  1995-10-19 Medical Record Number:  [id number]   Referring MD: Knox Royalty  Reason for Referral: Pseudotumor cerebri  HPI: Initial visit 02/28/2020 Alicia Swanson is a 24 year old African-American lady was seen today for initial office consultation visit.  She is accompanied by her mother.  History is obtained from them and review of electronic medical records and I have reviewed available imaging films in PACS.  She has past medical history of developmental delay, morbid obesity, obstructive sleep apnea, remote childhood seizures, lymphedema in both lower extremities and immune deficiency.  She started complaining of headaches for the last couple of months which are often known and severe and patient had to take Tylenol and Motrin quite frequently.  She also complained of blurred vision about a week ago patient was seen at [facility] by Dr. Georges Mouse.  Unfortunately I do not have his records but apparently she was diagnosed with bilateral papilledema with optic nerve pressure which was very high and difficult to measure.  Patient had an outpatient MRI scheduled but she developed nausea vomiting increasing headaches and instead went to the ER at [facility] on 02/09/2020 when she underwent MRI scan of the brain which showed partially empty sella and questionable T2 hyperintense intensity of the posterior intraorbital optic nerves but no other definite abnormalities.  MRV of the brain and orbits showed no evidence of dural venous thrombosis.  She subsequently had diagnostic spinal tap performed by radiology which showed elevated CSF opening pressure of 38 cm.  5 cc of spinal fluid was sent for testing which showed normal protein of 22 mg percent and CSF glucose of 66 mg percent.  There was 0 white cells and 170 red cells.  Patient  states that her headaches have improved since then she has had only headaches once or twice a week and Tylenol helps.  She was started on Diamox to 50 mg twice daily which she is tolerating well without any paresthesias or other side effects.  She is also started watching her diet and has lost some weight.  She also has bilateral lower extremity swellings and has been diagnosed with chronic lymphedema which she has had for years.  She also apparently has recurrent sinus infections and was diagnosed with immune deficiency.  She has history of developmental delay since birth and has not gone to school.  She used to work at [employer] but after [employer] she stopped going there.  She lives at home with her mother.  She can do most activities for daily living by herself.  She also has history of remote childhood seizures and was on Depakote for several years but she has been seizure-free for more than 15 years off anticonvulsants now. Update 06/04/2020 ; She returns for follow-up after last visit 3 months ago.  She is accompanied by her mother.  Patient states she is doing well.  She is in fact had no headaches.  Her vision is also good she denies any blurred vision or spots.  She is eating healthy and in fact has lost 40 pounds.  She is tolerating Topamax 50 mg twice daily well and feels her appetite is low and she is eating less because of that.  She has an upcoming appointment with ophthalmologist next week.  She is on norethindrone for menstrual bleeding and explained to  her that Topamax and higher doses may make it less efficient and if her menstrual bleeding is a problem she may need to discuss this with her gynecologist to increase the dose.  She remains on Diamox which is tolerating well without any side effects ROS:   14 system review of systems is positive for headache, blurred vision, tooth pain, dental abscess and all other systems negative PMH:  Past Medical History:  Diagnosis Date  . Anemia   .  Asthma   . Headache(784.0)   . IIH (idiopathic intracranial hypertension)   . Learning difficulty involving reading   . Lymphedema of both lower extremities    [facility]  . OSA (obstructive sleep apnea)   . Seizures (HCC)    hasn't had a known seizure since about 2013 per pt's guardian     Social History:  Social History   Socioeconomic History  . Marital status: Single    Spouse name: Not on file  . Number of children: 0  . Years of education: Not on file  . Highest education level: Not on file  Occupational History  . Occupation: unemployed  Tobacco Use  . Smoking status: Never Smoker  . Smokeless tobacco: Never Used  Vaping Use  . Vaping Use: Never used  Substance and Sexual Activity  . Alcohol use: No  . Drug use: No  . Sexual activity: Never    Comment: will be starting birth control 03/2020 for cycle management  Other Topics Concern  . Not on file  Social History Narrative   unemployed   Home Situation: lives with mother and father and grandma   Right Handed   Spiritual Beliefs: christian   Caffeine: soda, 2-3 caffeine daily   Social Determinants of Health   Financial Resource Strain:   . Difficulty of Paying Living Expenses: Not on file  Food Insecurity:   . Worried About Programme researcher, broadcasting/film/video in the Last Year: Not on file  . Ran Out of Food in the Last Year: Not on file  Transportation Needs:   . Lack of Transportation (Medical): Not on file  . Lack of Transportation (Non-Medical): Not on file  Physical Activity:   . Days of Exercise per Week: Not on file  . Minutes of Exercise per Session: Not on file  Stress:   . Feeling of Stress : Not on file  Social Connections:   . Frequency of Communication with Friends and Family: Not on file  . Frequency of Social Gatherings with Friends and Family: Not on file  . Attends Religious Services: Not on file  . Active Member of Clubs or Organizations: Not on file  . Attends Banker Meetings: Not on  file  . Marital Status: Not on file  Intimate Partner Violence:   . Fear of Current or Ex-Partner: Not on file  . Emotionally Abused: Not on file  . Physically Abused: Not on file  . Sexually Abused: Not on file    Medications:   Current Outpatient Medications on File Prior to Visit  Medication Sig Dispense Refill  . acetaminophen (TYLENOL) 325 MG tablet Take 975 mg by mouth every 6 (six) hours as needed for moderate pain.    . ferrous sulfate 325 (65 FE) MG tablet Take 1 tablet (325 mg total) by mouth 2 (two) times daily with a meal. 60 tablet 0  . senna-docusate (SENOKOT-S) 8.6-50 MG tablet Take 1 tablet by mouth 2 (two) times daily. 60 tablet 0  . topiramate (  TOPAMAX) 50 MG tablet Take 1 tablet (50 mg total) by mouth 2 (two) times daily. 60 tablet 3  . Vitamin D, Ergocalciferol, (DRISDOL) 1.25 MG (50000 UNIT) CAPS capsule Take 50,000 Units by mouth every 7 (seven) days.    Marland Kitchen acetaZOLAMIDE (DIAMOX) 125 MG tablet Take 2 tablets (250 mg total) by mouth 2 (two) times daily. 120 tablet 1   No current facility-administered medications on file prior to visit.    Allergies:   Allergies  Allergen Reactions  . Penicillins Rash        Physical Exam General: Morbidly obese young African-American lady seated, in no evident distress.  Mild proptosis Head: head normocephalic and atraumatic.   Neck: supple with no carotid or supraclavicular bruits Cardiovascular: regular rate and rhythm, no murmurs Musculoskeletal: no deformity Skin:  no rash/petichiae 2+ edema lower extremities. Vascular: Pulses difficult to feel in extremities.   Neurologic Exam Mental Status: Awake and fully alert. Oriented to place and time. Recent and remote memory intact. Attention span, concentration and fund of knowledge appropriate. Mood and affect appropriate.  Cranial Nerves: Fundoscopic exam difficult to visualize disc through undilated pupils.. Pupils equal, briskly reactive to light. Extraocular movements full  without nystagmus. Visual fields full to confrontation. Hearing intact. Facial sensation intact. Face, tongue, palate moves normally and symmetrically.  Motor: Normal bulk and tone. Normal strength in all tested extremity muscles. Sensory.: intact to touch , pinprick , position and vibratory sensation.  Coordination: Rapid alternating movements normal in all extremities. Finger-to-nose and heel-to-shin performed accurately bilaterally. Gait and Station: Arises from chair without difficulty. Stance is normal. Gait demonstrates normal stride length and balance . Able to heel, toe and tandem walk with moderate difficulty.  Reflexes: 1+ and symmetric. Toes downgoing.       ASSESSMENT: 24 year old African-American lady with 1 month history of intermittent headaches and blurred vision with bilateral papilledema likely due to pseudotumor cerebri.  She has shown response to spinal tap and starting Diamox.  History of developmental delay with remote history of childhood seizures now stable off antiepileptics.  Marland Kitchen    PLAN: I had a long discussion with the patient and her mother regarding her pseudotumor cerebri, headache and visual disturbance all of which appear to have improved on the current medication regime.  Recommend she continue Diamox 250 mg twice daily as well as Topamax 50 mg twice daily.  She was encouraged to eat healthy and be active and lose weight.  She was encouraged to keep upcoming appointment next week with ophthalmologist to do a detailed funduscopic exam.  She may stay on norethindrone for her perimenstrual bleeding and I informed her that Topamax and high doses may make it less efficient and she may need a higher dose.  She will return for follow-up in 3 months or call earlier if necessary. Continue follow-up with her ophthalmologist regularly to look for resolution of papilledema and enlargement of blind spot.  Greater than 50% time during this 25-minute follow-up visit was spent on  counseling and coordination of care about her headaches and papilledema and pseudotumor and answering questions.Delia Heady, MD  [facility] [address] [place], [place] [address]  Phone [phone] Fax [phone] Note: This document was prepared with digital dictation and possible smart phrase technology. Any transcriptional errors that result from this process are unintentional.

## 2020-06-13 ENCOUNTER — Telehealth: Payer: Self-pay | Admitting: *Deleted

## 2020-06-13 NOTE — Telephone Encounter (Signed)
R/c notes from [facility] notes on nurse desk.

## 2020-07-12 ENCOUNTER — Other Ambulatory Visit: Payer: Self-pay | Admitting: Neurology

## 2020-08-01 ENCOUNTER — Other Ambulatory Visit: Payer: Self-pay | Admitting: Neurology

## 2020-09-09 ENCOUNTER — Ambulatory Visit: Payer: 59 | Admitting: Neurology

## 2020-10-07 ENCOUNTER — Other Ambulatory Visit: Payer: Self-pay | Admitting: Neurology

## 2020-11-20 ENCOUNTER — Ambulatory Visit (INDEPENDENT_AMBULATORY_CARE_PROVIDER_SITE_OTHER): Payer: 59 | Admitting: Neurology

## 2020-11-20 ENCOUNTER — Encounter: Payer: Self-pay | Admitting: Neurology

## 2020-11-20 VITALS — BP 131/84 | HR 81 | Ht 67.0 in | Wt 287.2 lb

## 2020-11-20 DIAGNOSIS — G932 Benign intracranial hypertension: Secondary | ICD-10-CM

## 2020-11-20 NOTE — Progress Notes (Signed)
[facility] [address] [place]. [place] [address] [phone]       OFFICE FOLLOW UP VISITNOTE  [name] Date of Birth:  1995-10-19 Medical Record Number:  [id number]   Referring MD: Alicia Swanson  Reason for Referral: Pseudotumor cerebri  HPI: Initial visit 02/28/2020 Alicia Swanson is a 25 year old African-American lady was seen today for initial office consultation visit.  Alicia Swanson is accompanied by her mother.  History is obtained from them and review of electronic medical records and I have reviewed available imaging films in PACS.  Alicia Swanson has past medical history of developmental delay, morbid obesity, obstructive sleep apnea, remote childhood seizures, lymphedema in both lower extremities and immune deficiency.  Alicia Swanson started complaining of headaches for the last couple of months which are often known and severe and patient had to take Tylenol and Motrin quite frequently.  Alicia Swanson also complained of blurred vision about a week ago patient was seen at [facility] by Dr. Georges Swanson.  Unfortunately I do not have his records but apparently Alicia Swanson was diagnosed with bilateral papilledema with optic nerve pressure which was very high and difficult to measure.  Patient had an outpatient MRI scheduled but Alicia Swanson developed nausea vomiting increasing headaches and instead went to the ER at [facility] on 02/09/2020 when Alicia Swanson underwent MRI scan of the brain which showed partially empty sella and questionable T2 hyperintense intensity of the posterior intraorbital optic nerves but no other definite abnormalities.  MRV of the brain and orbits showed no evidence of dural venous thrombosis.  Alicia Swanson subsequently had diagnostic spinal tap performed by radiology which showed elevated CSF opening pressure of 38 cm.  5 cc of spinal fluid was sent for testing which showed normal protein of 22 mg percent and CSF glucose of 66 mg percent.  There was 0 white cells and 170 red cells.  Patient  states that her headaches have improved since then Alicia Swanson has had only headaches once or twice a week and Tylenol helps.  Alicia Swanson was started on Diamox to 50 mg twice daily which Alicia Swanson is tolerating well without any paresthesias or other side effects.  Alicia Swanson is also started watching her diet and has lost some weight.  Alicia Swanson also has bilateral lower extremity swellings and has been diagnosed with chronic lymphedema which Alicia Swanson has had for years.  Alicia Swanson also apparently has recurrent sinus infections and was diagnosed with immune deficiency.  Alicia Swanson has history of developmental delay since birth and has not gone to school.  Alicia Swanson used to work at [employer] but after [employer] Alicia Swanson stopped going there.  Alicia Swanson lives at home with her mother.  Alicia Swanson can do most activities for daily living by herself.  Alicia Swanson also has history of remote childhood seizures and was on Depakote for several years but Alicia Swanson has been seizure-free for more than 15 years off anticonvulsants now. Update 06/04/2020 ; Alicia Swanson returns for follow-up after last visit 3 months ago.  Alicia Swanson is accompanied by her mother.  Patient states Alicia Swanson is doing well.  Alicia Swanson is in fact had no headaches.  Her vision is also good Alicia Swanson denies any blurred vision or spots.  Alicia Swanson is eating healthy and in fact has lost 40 pounds.  Alicia Swanson is tolerating Topamax 50 mg twice daily well and feels her appetite is low and Alicia Swanson is eating less because of that.  Alicia Swanson has an upcoming appointment with ophthalmologist next week.  Alicia Swanson is on norethindrone for menstrual bleeding and explained to  her that Topamax and higher doses may make it less efficient and if her menstrual bleeding is a problem Alicia Swanson may need to discuss this with her gynecologist to increase the dose.  Alicia Swanson remains on Diamox which is tolerating well without any side effects Update 11/20/2020 : Alicia Swanson returns for follow-up after last visit 6 months ago.  Alicia Swanson is accompanied by mother and her niece.  Alicia Swanson continues to do well.  Alicia Swanson is is not having any frequent headaches.  Alicia Swanson  says Alicia Swanson has occasional headaches and Alicia Swanson takes some Advil which helps.  Alicia Swanson denies any blurred vision or seeing visual spots.  Alicia Swanson was recently seen by ophthalmologist but I do not have the records A. fib he stated that there is no pressure on her eyes and to discuss with me whether her medications can be tapered or discontinued.  The patient however has gained about 20-30 pounds since her last visit.  This is mostly due to her eating habits have not been disciplined in good.  Alicia Swanson has no other complaints today. ROS:   14 system review of systems is positive for headache, lack of sleep and weight gain only and all other systems negative PMH:  Past Medical History:  Diagnosis Date  . Anemia   . Asthma   . Headache(784.0)   . IIH (idiopathic intracranial hypertension)   . Learning difficulty involving reading   . Lymphedema of both lower extremities    [facility]  . OSA (obstructive sleep apnea)   . Seizures (HCC)    hasn't had a known seizure since about 2013 per pt's guardian     Social History:  Social History   Socioeconomic History  . Marital status: Single    Spouse name: Not on file  . Number of children: 0  . Years of education: Not on file  . Highest education level: Not on file  Occupational History  . Occupation: unemployed  Tobacco Use  . Smoking status: Never Smoker  . Smokeless tobacco: Never Used  Vaping Use  . Vaping Use: Never used  Substance and Sexual Activity  . Alcohol use: No  . Drug use: No  . Sexual activity: Never    Comment: will be starting birth control 03/2020 for cycle management  Other Topics Concern  . Not on file  Social History Narrative   unemployed   Home Situation: lives with mother and father and grandma   Right Handed   Spiritual Beliefs: christian   Caffeine: soda, 2-3 caffeine daily   Social Determinants of Health   Financial Resource Strain: Not on file  Food Insecurity: Not on file  Transportation Needs: Not on file   Physical Activity: Not on file  Stress: Not on file  Social Connections: Not on file  Intimate Partner Violence: Not on file    Medications:   Current Outpatient Medications on File Prior to Visit  Medication Sig Dispense Refill  . acetaminophen (TYLENOL) 325 MG tablet Take 975 mg by mouth every 6 (six) hours as needed for moderate pain.    Marland Kitchen acetaZOLAMIDE (DIAMOX) 125 MG tablet TAKE 2 TABLETS BY MOUTH TWICE A DAY 360 tablet 1  . ferrous sulfate 325 (65 FE) MG tablet Take 1 tablet (325 mg total) by mouth 2 (two) times daily with a meal. 60 tablet 0  . norethindrone (MICRONOR) 0.35 MG tablet Take 1 tablet by mouth daily.    Marland Kitchen senna-docusate (SENOKOT-S) 8.6-50 MG tablet Take 1 tablet by mouth 2 (two) times daily.  60 tablet 0  . topiramate (TOPAMAX) 50 MG tablet TAKE 1 TABLET BY MOUTH TWICE A DAY 180 tablet 1  . Vitamin D, Ergocalciferol, (DRISDOL) 1.25 MG (50000 UNIT) CAPS capsule Take 50,000 Units by mouth every 7 (seven) days.     No current facility-administered medications on file prior to visit.    Allergies:   Allergies  Allergen Reactions  . Penicillins Rash        Physical Exam General: Morbidly obese young African-American lady seated, in no evident distress.  Mild proptosis Head: head normocephalic and atraumatic.   Neck: supple with no carotid or supraclavicular bruits Cardiovascular: regular rate and rhythm, no murmurs Musculoskeletal: no deformity Skin:  no rash/petichiae 2+ edema lower extremities. Vascular: Pulses difficult to feel in extremities.   Neurologic Exam Mental Status: Awake and fully alert. Oriented to place and time. Recent and remote memory intact. Attention span, concentration and fund of knowledge appropriate. Mood and affect appropriate.  Cranial Nerves: Fundoscopic exam difficult to visualize disc through undilated pupils.. Pupils equal, briskly reactive to light. Extraocular movements full without nystagmus. Visual fields full to confrontation.  Hearing intact. Facial sensation intact. Face, tongue, palate moves normally and symmetrically.  Motor: Normal bulk and tone. Normal strength in all tested extremity muscles. Sensory.: intact to touch , pinprick , position and vibratory sensation.  Coordination: Rapid alternating movements normal in all extremities. Finger-to-nose and heel-to-shin performed accurately bilaterally. Gait and Station: Arises from chair without difficulty. Stance is normal. Gait demonstrates normal stride length and balance . Able to heel, toe and tandem walk with moderate difficulty.  Reflexes: 1+ and symmetric. Toes downgoing.       ASSESSMENT: 25 year old African-American lady with 1 month history of intermittent headaches and blurred vision with bilateral papilledema likely due to pseudotumor cerebri.  Alicia Swanson has shown response to spinal tap and starting Diamox.  History of developmental delay with remote history of childhood seizures now stable off antiepileptics.  . Stable pseudotumor with currently in no active symptoms of headaches or visual disturbance    PLAN: I had a long discussion with the patient and her mother regarding her pseudotumor cerebri, headache and visual disturbance all of which appear to have improved on the current medication regime.  Recommend Alicia Swanson continue Diamox 250 mg twice daily as well as Topamax 50 mg twice daily.  Alicia Swanson was encouraged to eat healthy and be active and lose weight.  Alicia Swanson was encouraged to keep follow up appointment   with ophthalmologist to do  detailed funduscopic exam.  Alicia Swanson may stay on norethindrone for her perimenstrual bleeding and I informed her that Topamax and high doses may make it less efficient and Alicia Swanson may need a higher dose.  Alicia Swanson will return for follow-up in 6 months or call earlier if necessary. Continue follow-up with her ophthalmologist regularly to look for resolution of papilledema and enlargement of blind spot.Greater than 50% time during this 25-minute follow-up  visit was spent on counseling and coordination of care about her headaches and papilledema and pseudotumor and answering questions.Delia Heady, MD  [facility] [address] [place], [place] [address]  Phone [phone] Fax [phone] Note: This document was prepared with digital dictation and possible smart phrase technology. Any transcriptional errors that result from this process are unintentional.

## 2020-11-20 NOTE — Patient Instructions (Addendum)
I had a long discussion with the patient and her mother regarding her pseudotumor cerebri, headache and visual disturbance all of which appear to have improved on the current medication regime.  Recommend she continue Diamox 250 mg twice daily as well as Topamax 50 mg twice daily.  She was encouraged to eat healthy and be active and lose weight.  She was encouraged to keep follow up appointment   with ophthalmologist to do  detailed funduscopic exam.  She may stay on norethindrone for her perimenstrual bleeding and I informed her that Topamax and high doses may make it less efficient and she may need a higher dose.  She will return for follow-up in 6 months or call earlier if necessary. Continue follow-up with her ophthalmologist regularly to look for resolution of papilledema and enlargement of blind spot.

## 2021-05-22 ENCOUNTER — Ambulatory Visit: Payer: 59 | Admitting: Neurology

## 2021-09-22 ENCOUNTER — Telehealth: Payer: Self-pay | Admitting: Neurology

## 2021-09-22 ENCOUNTER — Ambulatory Visit: Payer: 59 | Admitting: Neurology

## 2021-09-22 DIAGNOSIS — H6993 Unspecified Eustachian tube disorder, bilateral: Secondary | ICD-10-CM | POA: Insufficient documentation

## 2021-09-22 DIAGNOSIS — L299 Pruritus, unspecified: Secondary | ICD-10-CM | POA: Insufficient documentation

## 2021-09-22 DIAGNOSIS — H6122 Impacted cerumen, left ear: Secondary | ICD-10-CM | POA: Insufficient documentation

## 2021-09-22 DIAGNOSIS — H6983 Other specified disorders of Eustachian tube, bilateral: Secondary | ICD-10-CM | POA: Insufficient documentation

## 2021-09-22 NOTE — Telephone Encounter (Signed)
Pt's mother, Percival Spanish Rdenhour called at 3:35p that she would  be late, but is on her way. Inform Ms. Goedken she may have to reschedule the appt when she gets her.

## 2021-09-23 ENCOUNTER — Other Ambulatory Visit: Payer: Self-pay

## 2021-09-23 MED ORDER — TOPIRAMATE 50 MG PO TABS: 50.0000 mg | ORAL_TABLET | Freq: Two times a day (BID) | ORAL | 1 refills | Status: DC

## 2021-09-23 NOTE — Progress Notes (Unsigned)
Rx refilled pending cosign.

## 2021-09-23 NOTE — Telephone Encounter (Signed)
Please confirm dosage of topimate 50 mg. Dosage change was discussed last visit.

## 2021-10-01 ENCOUNTER — Other Ambulatory Visit: Payer: Self-pay | Admitting: *Deleted

## 2021-10-01 MED ORDER — ACETAZOLAMIDE 125 MG PO TABS: 250.0000 mg | ORAL_TABLET | Freq: Two times a day (BID) | ORAL | 0 refills | Status: DC

## 2021-10-08 ENCOUNTER — Ambulatory Visit (INDEPENDENT_AMBULATORY_CARE_PROVIDER_SITE_OTHER): Payer: 59 | Admitting: Neurology

## 2021-10-08 ENCOUNTER — Encounter: Payer: Self-pay | Admitting: Neurology

## 2021-10-08 VITALS — BP 113/78 | HR 80 | Ht 67.0 in | Wt 309.8 lb

## 2021-10-08 DIAGNOSIS — G932 Benign intracranial hypertension: Secondary | ICD-10-CM | POA: Diagnosis not present

## 2021-10-08 MED ORDER — TOPIRAMATE 50 MG PO TABS: 50.0000 mg | ORAL_TABLET | Freq: Two times a day (BID) | ORAL | 1 refills | Status: DC

## 2021-10-08 NOTE — Progress Notes (Signed)
[facility] [address] [place]. [place] [phone]       OFFICE FOLLOW UP VISITNOTE  Ms. Hector Brunswickyasia M Cogbill Date of Birth:  02/02/1996 Medical Record Number:  [id number]   Referring MD: Knox RoyaltyEnrico Jones  Reason for Referral: Pseudotumor cerebri  HPI: Initial visit 02/28/2020 Ms. Righter is a 26 year old African-American lady was seen today for initial office consultation visit.  She is accompanied by her mother.  History is obtained from them and review of electronic medical records and I have reviewed available imaging films in PACS.  She has past medical history of developmental delay, morbid obesity, obstructive sleep apnea, remote childhood seizures, lymphedema in both lower extremities and immune deficiency.  She started complaining of headaches for the last couple of months which are often known and severe and patient had to take Tylenol and Motrin quite frequently.  She also complained of blurred vision about a week ago patient was seen at [facility] by Dr. Georges Mousehristopher Shah.  Unfortunately I do not have his records but apparently she was diagnosed with bilateral papilledema with optic nerve pressure which was very high and difficult to measure.  Patient had an outpatient MRI scheduled but she developed nausea vomiting increasing headaches and instead [facility]2020 when she underwent MRI scan of the brain which showed partially empty sella and questionable T2 hyperintense intensity of the posterior intraorbital optic nerves but no other definite abnormalities.  MRV of the brain and orbits showed no evidence of dural venous thrombosis.  She subsequently had diagnostic spinal tap performed by radiology which showed elevated CSF opening pressure of 38 cm.  5 cc of spinal fluid was sent for testing which showed normal protein of 22 mg percent and CSF glucose of 66 mg percent.  There was 0 white cells and 170 red cells.  Patient  states that her headaches have improved since then she has had only headaches once or twice a week and Tylenol helps.  She was started on Diamox to 50 mg twice daily which she is tolerating well without any paresthesias or other side effects.  She is also started watching her diet and has lost some weight.  She also has bilateral lower extremity swellings and has been diagnosed with chronic lymphedema which she has had for years.  She also apparently has recurrent sinus infections and was diagnosed with immune deficiency.  She has history of developmental delay since birth and has not gone to school.  She used to work at [employer] but after [employer] she stopped going there.  She lives at home with her mother.  She can do most activities for daily living by herself.  She also has history of remote childhood seizures and was on Depakote for several years but she has been seizure-free for more than 15 years off anticonvulsants now. Update 06/04/2020 ; She returns for follow-up after last visit 3 months ago.  She is accompanied by her mother.  Patient states she is doing well.  She is in fact had no headaches.  Her vision is also good she denies any blurred vision or spots.  She is eating healthy and in fact has lost 40 pounds.  She is tolerating Topamax 50 mg twice daily well and feels her appetite is low and she is eating less because of that.  She has an upcoming appointment with ophthalmologist next week.  She is on norethindrone for menstrual bleeding and explained to  her that Topamax and higher doses may make it less efficient and if her menstrual bleeding is a problem she may need to discuss this with her gynecologist to increase the dose.  She remains on Diamox which is tolerating well without any side effects Update 11/20/2020 : She returns for follow-up after last visit 6 months ago.  She is accompanied by mother and her niece.  She continues to do well.  She is is not having any frequent headaches.  She  says she has occasional headaches and she takes some Advil which helps.  She denies any blurred vision or seeing visual spots.  She was recently seen by ophthalmologist but I do not have the records A. fib he stated that there is no pressure on her eyes and to discuss with me whether her medications can be tapered or discontinued.  The patient however has gained about 20-30 pounds since her last visit.  This is mostly due to her eating habits have not been disciplined in good.  She has no other complaints today. Update 10/08/2021 : Patient returns for follow-up after last visit 10 months ago.  She is accompanied by mother.  Patient has been noncompliant with her medications and has gained 30 to 40 pounds.  The mother started noticing several months ago that patient was not taking medication regularly as medicines ended up in the laundry clothes.  Patient was evasive with her answers and states that she forgets to take her medicines.  Patient called and got refills for the medicines and for the last for 5 days has started taking Diamox 250 twice daily Topamax 25 twice daily.  Patient denies headaches today but she was seen by ophthalmologist last Tuesday who noticed significant elevation of intraocular pressures to the 80s and 1 the patient that she would turn blind and lose vision and needs to see neurology have spinal tap done.  Patient has some developmental delay at baseline and limitation in understanding which likely contributed to medication noncompliance.  She is willing for a spinal tap and going back on the medications and strict diet and losing weight. ROS:   14 system review of systems is positive for headache, blurred vision lack of sleep and weight gain only and all other systems negative PMH:  Past Medical History:  Diagnosis Date   Anemia    Asthma    Headache(784.0)    IIH (idiopathic intracranial hypertension)    Learning difficulty involving reading    Lymphedema of both lower  extremities    [facility]   OSA (obstructive sleep apnea)    Seizures (HCC)    hasn't had a known seizure since about 2013 per pt's guardian     Social History:  Social History   Socioeconomic History   Marital status: Single    Spouse name: Not on file   Number of children: 0   Years of education: Not on file   Highest education level: Not on file  Occupational History   Occupation: unemployed  Tobacco Use   Smoking status: Never   Smokeless tobacco: Never  Vaping Use   Vaping Use: Never used  Substance and Sexual Activity   Alcohol use: No   Drug use: No   Sexual activity: Never    Comment: will be starting birth control 03/2020 for cycle management  Other Topics Concern   Not on file  Social History Narrative   unemployed   Home Situation: lives with mother and father and grandma  Right Handed   Spiritual Beliefs: christian   Caffeine: soda, 2-3 caffeine daily   Social Determinants of Health   Financial Resource Strain: Not on file  Food Insecurity: Not on file  Transportation Needs: Not on file  Physical Activity: Not on file  Stress: Not on file  Social Connections: Not on file  Intimate Partner Violence: Not on file    Medications:   Current Outpatient Medications on File Prior to Visit  Medication Sig Dispense Refill   acetaminophen (TYLENOL) 325 MG tablet Take 975 mg by mouth every 6 (six) hours as needed for moderate pain.     acetaZOLAMIDE (DIAMOX) 125 MG tablet Take 2 tablets (250 mg total) by mouth 2 (two) times daily. 360 tablet 0   ferrous sulfate 325 (65 FE) MG tablet Take 1 tablet (325 mg total) by mouth 2 (two) times daily with a meal. 60 tablet 0   norethindrone (MICRONOR) 0.35 MG tablet Take 1 tablet by mouth daily.     senna-docusate (SENOKOT-S) 8.6-50 MG tablet Take 1 tablet by mouth 2 (two) times daily. 60 tablet 0   Vitamin D, Ergocalciferol, (DRISDOL) 1.25 MG (50000 UNIT) CAPS capsule Take 50,000 Units by mouth every 7 (seven) days.      No current facility-administered medications on file prior to visit.    Allergies:   Allergies  Allergen Reactions   Penicillins Rash        Physical Exam General: Morbidly obese young African-American lady seated, in no evident distress.  Mild proptosis Head: head normocephalic and atraumatic.   Neck: supple with no carotid or supraclavicular bruits Cardiovascular: regular rate and rhythm, no murmurs Musculoskeletal: no deformity Skin:  no rash/petichiae 2+ edema lower extremities. Vascular: Pulses difficult to feel in extremities.   Neurologic Exam Mental Status: Awake and fully alert. Oriented to place and time. Recent and remote memory intact. Attention span, concentration and fund of knowledge appropriate. Mood and affect appropriate.  Cranial Nerves: Fundoscopic exam difficult to visualize disc through undilated pupils and photophobia.. Pupils equal, briskly reactive to light. Extraocular movements full without nystagmus. Visual fields full to confrontation. Hearing intact. Facial sensation intact. Face, tongue, palate moves normally and symmetrically.  Motor: Normal bulk and tone. Normal strength in all tested extremity muscles. Sensory.: intact to touch , pinprick , position and vibratory sensation.  Coordination: Rapid alternating movements normal in all extremities. Finger-to-nose and heel-to-shin performed accurately bilaterally. Gait and Station: Arises from chair without difficulty. Stance is normal. Gait demonstrates normal stride length and balance . Able to heel, toe and tandem walk with moderate difficulty.  Reflexes: 1+ and symmetric. Toes downgoing.       ASSESSMENT: 26 year old African-American lady with   history of intermittent headaches and blurred vision with bilateral papilledema likely due to pseudotumor cerebri.  She has shown response to spinal tap and starting Diamox.  History of developmental delay with remote history of childhood seizures now stable  off antiepileptics. Recurrent pseudotumor with elevated intraocular pressure due to medication noncompliance and recent weight gain    PLAN: I had a long discussion with this patient and her mother regarding her pseudotumor cerebri which appears to be exacerbated due to medication noncompliance and weight gain.  I counseled the patient to take her medications regularly and to lose weight.  Increase the dose of Topamax to 50 mg twice daily and continue Diamox 500 twice daily.  We will refer the patient for fluoroscopy guided spinal tap to drain some CSF as well for some  immediate relief.  I will refer the patient to Dr. [place] headache specialist to manage her pseudotumor.  I warned the patient that she will lose vision permanently if she is not able to lose weight and be compliant with her medication regimen.  She voiced understanding. Continue follow-up with her ophthalmologist regularly to look for resolution of papilledema and enlargement of blind spot.Greater than 50% time during this 45-minute follow-up visit was spent on counseling and coordination of care about her headaches and papilledema and pseudotumor and answering questions.Delia Heady, MD  [facility] [address] [place], [place] [address]  Phone [phone] Fax [phone] Note: This document was prepared with digital dictation and possible smart phrase technology. Any transcriptional errors that result from this process are unintentional.

## 2021-10-08 NOTE — Addendum Note (Signed)
Addended by: Gates Rigg on: 10/08/2021 06:00 PM   Modules accepted: Orders

## 2021-10-08 NOTE — Patient Instructions (Signed)
I had a long discussion with this patient and her mother regarding her pseudotumor cerebri which appears to be exacerbated due to medication noncompliance and weight gain.  I counseled the patient to take her medications regularly and to lose weight.  Increase the dose of Topamax to 50 mg twice daily and continue Diamox 500 twice daily.  We will refer the patient for fluoroscopy guided spinal tap to drain some CSF as well for some immediate relief.  I will refer the patient to Dr. [place] headache specialist to manage her pseudotumor.  I warned the patient that she will lose vision permanently if she is not able to lose weight and be compliant with her medication regimen.  She voiced understanding.

## 2021-10-09 ENCOUNTER — Telehealth: Payer: Self-pay | Admitting: Neurology

## 2021-10-09 NOTE — Telephone Encounter (Signed)
Sent Cathy at GI a message to schedule the patient as soon as possible.

## 2021-10-13 ENCOUNTER — Ambulatory Visit
Admission: RE | Admit: 2021-10-13 | Discharge: 2021-10-13 | Disposition: A | Discharge status: Home / Self Care | Payer: 59 | Source: Ambulatory Visit | Attending: Neurology | Admitting: Neurology

## 2021-10-13 ENCOUNTER — Other Ambulatory Visit: Payer: Self-pay

## 2021-10-13 ENCOUNTER — Telehealth: Payer: Self-pay | Admitting: *Deleted

## 2021-10-13 IMAGING — XA DG SPINAL PUNCT LUMBAR DIAG WITH FL CT GUIDANCE
1 series · 1 of 1 positions shown · non-contrast
Comparison: [DATE].

CLINICAL DATA: Pseudotumor cerebri.

EXAM:
DIAGNOSTIC LUMBAR PUNCTURE UNDER FLUOROSCOPIC GUIDANCE

[Series 1: ortho adipose · 1 of 1 slices shown]
[im 1/1]
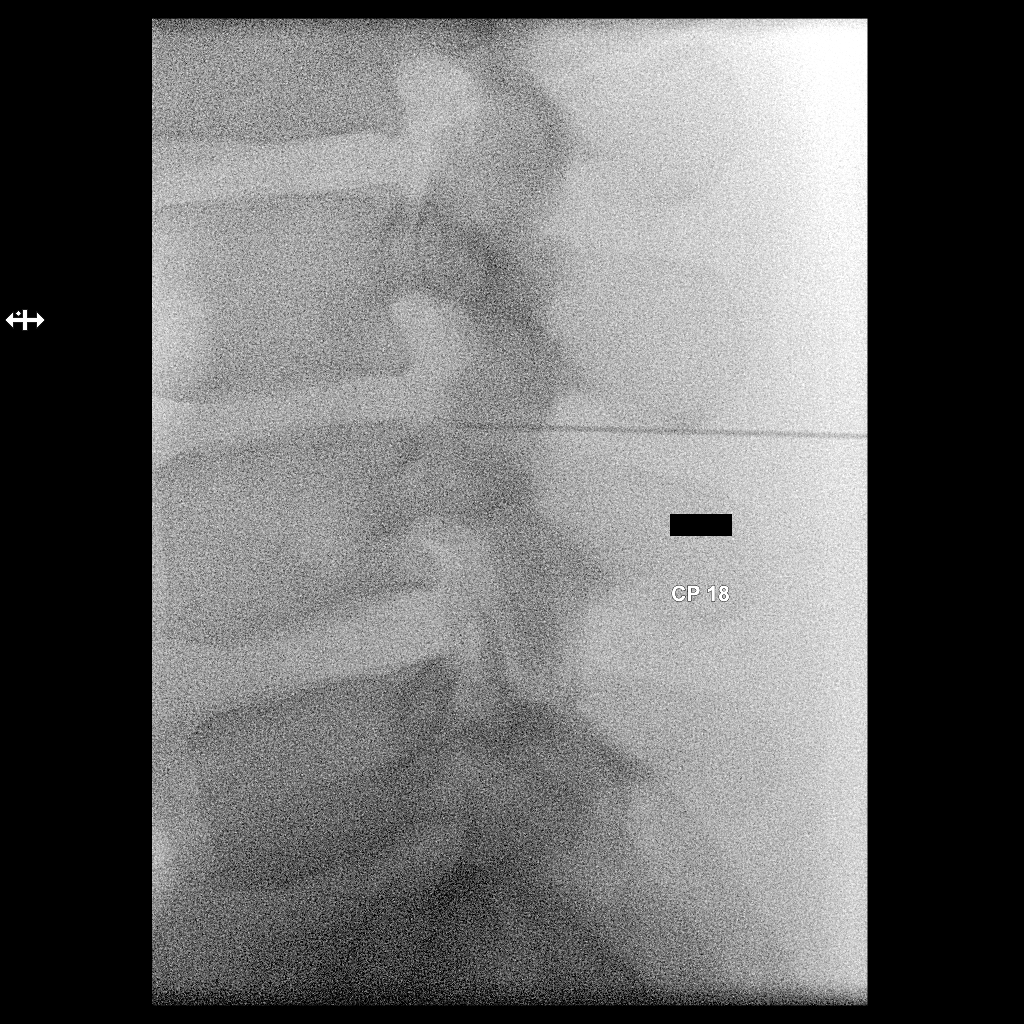

[1 of 1 positions shown; findings below may reference images not displayed]

FLUOROSCOPY:
Fluoroscopy Time:  8.5 mGy Kerma

PROCEDURE:
Informed consent was obtained from the patient prior to the
procedure, including potential complications of headache, allergy,
and pain. With the patient in the left lateral decubitus position,
the lower back was prepped with Betadine. 1% Lidocaine was used for
local anesthesia. Lumbar puncture was performed at the L3-L4 level
using a 6 inch 20 gauge needle with return of blood-tinged CSF with
an opening pressure of 29 cm water. 24 ml of CSF were removed.
Closing pressure was 18 cm water. The patient tolerated the
procedure well and there were no apparent complications.
IMPRESSION: Technically successful fluoroscopically guided lumbar puncture.

## 2021-10-13 NOTE — Telephone Encounter (Signed)
-----   Message from Ocie Doyne, MD sent at 10/13/2021  1:03 PM EST ----- Opening pressure is elevated, which is consistent with IIH. She should continue to take her Topamax and Diamox as directed.

## 2021-10-13 NOTE — Discharge Instructions (Signed)

## 2021-10-13 NOTE — Telephone Encounter (Signed)
Called and spoke with pt's mother, [name] (as per DPR). She inquired about Topamax dosage adjustment. I instructed her as per last office visit note by Dr. Pearlean Brownie to "Increase the dose of Topamax to 50 mg twice daily". Pt verbalized understanding and expressed appreciation for the call. All questions answered.

## 2021-11-10 NOTE — Progress Notes (Signed)
? ?Referring:  ?Micki Riley, MD ?[address] ?[address] ?[place],  [place] [address] ? ?PCP: ?Knox Royalty, MD ? ?Neurology was asked to evaluate Alicia Swanson, a 26 year old female for a chief complaint of headaches.  Our recommendations of care will be communicated by shared medical record.   ? ?CC:  headaches ? ?History provided from self and mother ? ?HPI:  ?Medical co-morbidities: developmental delay, OSA, childhood seizures, lymphedema, iron deficiency anemia ? ?The patient presents for evaluation of headaches which began in 2021. She developed blurred vision and was seen by ophthalmology who noted bilateral papilledema. MRI brain/orbits 02/09/20 showed a partially empty sella. MRV was unremarkable. LP was done which revealed an opening pressure of 38. She was started on Diamox and Topamax, and did well for a while until she started managing her own medications (mother had been managing them before this). She stopped taking medications and her headaches started to return. Saw ophthalmology who noted her papilledema had returned. Repeat LP was ordered on 10/13/21 which showed an opening pressure of 29.  ? ?Currently she is taking Topamax 50 mg BID and Diamox 250 mg BID. She has not had any headaches or vision changes since restarting her medications. ? ?She has an ophthalmology follow up next month. ? ?LABS: ?CBC ?   ?Component Value Date/Time  ? WBC 6.0 02/11/2020 0536  ? RBC 4.63 02/11/2020 0536  ? HGB 8.8 (L) 02/11/2020 0536  ? HCT 32.4 (L) 02/11/2020 0536  ? PLT 402 (H) 02/11/2020 0536  ? MCV 70.0 (L) 02/11/2020 0536  ? MCH 19.0 (L) 02/11/2020 0536  ? MCHC 27.2 (L) 02/11/2020 0536  ? RDW 23.8 (H) 02/11/2020 0536  ? LYMPHSABS 2.9 02/09/2020 1657  ? MONOABS 0.5 02/09/2020 1657  ? EOSABS 0.1 02/09/2020 1657  ? BASOSABS 0.0 02/09/2020 1657  ? ? ?  Latest Ref Rng & Units 02/11/2020  ?  5:36 AM 02/10/2020  ? 11:06 AM 02/09/2020  ?  4:57 PM  ?CMP  ?Glucose 70 - 99 mg/dL 84   92   82    ?BUN 6 - 20 mg/dL 7   8    6     ?Creatinine 0.44 - 1.00 mg/dL   6.27   0.35    ?Sodium 135 - 145 mmol/L 140   140   142    ?Potassium 3.5 - 5.1 mmol/L 4.2   4.0   3.7    ?Chloride 98 - 111 mmol/L 107   106   106    ?CO2 22 - 32 mmol/L 24   25   26     ?Calcium 8.9 - 10.3 mg/dL 8.3   8.8   8.6    ?Total Protein 6.5 - 8.1 g/dL   6.9    ?Total Bilirubin 0.3 - 1.2 mg/dL   0.5    ?Alkaline Phos 38 - 126 U/L   59    ?AST 15 - 41 U/L   18    ?ALT 0 - 44 U/L   11    ? ? ? ?IMAGING:  ?MRI brain/orbits 03/21/20: normal ?MRV 02/09/20: stenosis at bilateral transverse-sigmoid sinus junctions, no evidence of thrombosis ? ?Imaging independently reviewed on November 11, 2021  ? ?Current Outpatient Medications on File Prior to Visit  ?Medication Sig Dispense Refill  ? acetaminophen (TYLENOL) 325 MG tablet Take 975 mg by mouth every 6 (six) hours as needed for moderate pain.    ? acetaZOLAMIDE (DIAMOX) 125 MG tablet Take 2 tablets (  250 mg total) by mouth 2 (two) times daily. 360 tablet 0  ? ferrous sulfate 325 (65 FE) MG tablet Take 1 tablet (325 mg total) by mouth 2 (two) times daily with a meal. 60 tablet 0  ? norethindrone (MICRONOR) 0.35 MG tablet Take 1 tablet by mouth daily.    ? senna-docusate (SENOKOT-S) 8.6-50 MG tablet Take 1 tablet by mouth 2 (two) times daily. 60 tablet 0  ? topiramate (TOPAMAX) 50 MG tablet Take 1 tablet (50 mg total) by mouth 2 (two) times daily. 180 tablet 1  ? Vitamin D, Ergocalciferol, (DRISDOL) 1.25 MG (50000 UNIT) CAPS capsule Take 50,000 Units by mouth every 7 (seven) days.    ? ?No current facility-administered medications on file prior to visit.  ? ? ?Family History: ?Family History  ?Problem Relation Age of Onset  ? Migraines Mother   ? Seizures Maternal Aunt   ? Hypertension Maternal Grandmother   ? Stroke Maternal Grandfather   ? Asthma Brother   ? Asthma Sister   ? Pseudotumor cerebri Neg Hx   ? ? ? ?Past Medical History: ?Past Medical History:  ?Diagnosis Date  ? Anemia   ? Asthma   ? Headache(784.0)   ? IIH  (idiopathic intracranial hypertension)   ? Learning difficulty involving reading   ? Lymphedema of both lower extremities   ? [place]  ? OSA (obstructive sleep apnea)   ? Seizures (HCC)   ? hasn't had a known seizure since about 2013 per pt's guardian   ? ? ?Past Surgical History ?Past Surgical History:  ?Procedure Laterality Date  ? ADENOIDECTOMY    ? EAR TUBE REMOVAL Bilateral 2001  ? HERNIA REPAIR    ? TONSILLECTOMY Bilateral 2001  ? TYMPANOSTOMY TUBE PLACEMENT Bilateral 1998  ? ? ?Social History: ?Social History  ? ?Tobacco Use  ? Smoking status: Never  ? Smokeless tobacco: Never  ?Vaping Use  ? Vaping Use: Never used  ?Substance Use Topics  ? Alcohol use: No  ? Drug use: No  ? ? ?ROS: ?Negative for fevers, chills. Positive for headaches. All other systems reviewed and negative unless stated otherwise in HPI. ? ? ?Physical Exam:  ? ?Vital Signs: ?BP 116/82   Pulse 82   Ht 5\' 7"  (1.702 m)   Wt (!) 308 lb (139.7 kg)   SpO2 98%   BMI 48.24 kg/m?  ?GENERAL: well appearing,in no acute distress,alert ?SKIN:  Color, texture, turgor normal. No rashes or lesions ?HEAD:  Normocephalic/atraumatic. ?CV:  RRR ?RESP: Normal respiratory effort ?MSK: no tenderness to palpation over occiput, neck, or shoulders ? ?NEUROLOGICAL: ?Mental Status: Developmentally delayed, responds with yes or no ?Cranial Nerves: PERRL, visual fields intact to confrontation, extraocular movements intact, facial sensation intact, no facial droop or ptosis, hearing grossly intact, no dysarthria ?Motor: muscle strength 5/5 both upper and lower extremities ?Reflexes: 1+ throughout ?Sensation: intact to light touch all 4 extremities ?Coordination: Finger-to- nose-finger intact bilaterally ?Gait: normal-based ? ? ?IMPRESSION: ?26 year old female with a history of developmental delay, OSA, childhood seizures, lymphedema, iron deficiency anemia who presents for evaluation of IIH. Headaches have resolved with restarting Diamox and Topamax. She does  not report any side effects at this time. She will see her ophthalmologist next month. If papilledema has improved at that time will consider switching her to Topamax monotherapy to help simplify her medication regimen. ? ?PLAN:  ?-Continue Topamax 50 mg BID, Diamox 250 mg BID ?-Follow up with Ophthalmology scheduled for next month ?-next steps:  consider switching to Topamax monotherapy if papilledema has improved at next Ophtho visit ? ? ?I spent a total of 30 minutes chart reviewing and counseling the patient. Headache education was done. Discussed treatment options including preventive medications. Discussed medication side effects, adverse reactions and drug interactions. Written educational materials and patient instructions outlining all of the above were given. ? ?Follow-up: 4 months ? ? ?Ocie Doyne, MD ?11/11/2021   ?10:10 AM ? ? ?

## 2021-11-11 ENCOUNTER — Encounter: Payer: Self-pay | Admitting: Psychiatry

## 2021-11-11 ENCOUNTER — Ambulatory Visit (INDEPENDENT_AMBULATORY_CARE_PROVIDER_SITE_OTHER): Payer: 59 | Admitting: Psychiatry

## 2021-11-11 VITALS — BP 116/82 | HR 82 | Ht 67.0 in | Wt 308.0 lb

## 2021-11-11 DIAGNOSIS — G932 Benign intracranial hypertension: Secondary | ICD-10-CM

## 2021-12-03 ENCOUNTER — Telehealth: Payer: Self-pay | Admitting: *Deleted

## 2021-12-03 NOTE — Telephone Encounter (Signed)
Patient brought copy of [facility] notes dated 12/03/21 for Dr Quentin Mulling review. She is aware Dr Delena Bali is out of office x 2 weeks. Placed on MD desk. Of note: visual field interpretation: stable both eyes. ?

## 2021-12-16 ENCOUNTER — Telehealth: Payer: Self-pay

## 2021-12-16 NOTE — Telephone Encounter (Signed)
-----   Message from Ocie Doyne, MD sent at 12/16/2021 12:41 PM EDT ----- ?Regarding: Ophtho notes ?Her recent ophthalmology note shows that the swelling in her eyes has resolved. She can decrease her Diamox to 250 mg daily for one week, then stop it. She should continue the Topamax at the current dose for now. ? ?

## 2021-12-16 NOTE — Telephone Encounter (Signed)
Contacted pt mother per DPR, informed her ophthalmology note shows that the swelling in her eyes has resolved. She can decrease her Diamox to 250 mg daily for one week, then stop it. She should continue the Topamax at the current dose for now. She verbally understood and was appreciative.  ?

## 2022-01-07 ENCOUNTER — Other Ambulatory Visit: Payer: Self-pay | Admitting: Neurology

## 2022-01-08 ENCOUNTER — Telehealth: Payer: Self-pay | Admitting: Psychiatry

## 2022-01-08 NOTE — Telephone Encounter (Signed)
Called mother and reviewed message from 12/16/21: Her recent ophthalmology note shows that the swelling in her eyes has resolved. She can decrease her Diamox to 250 mg daily for one week, then stop it. Mother stated she forgot instructions and continued diamox 250  mg daily since then. She asked when to stop; I advised she stop it today. She verbalized understanding, appreciation and confirmed the FU appointment. Marland Kitchen

## 2022-01-08 NOTE — Telephone Encounter (Signed)
Pt's mother is asking for a call ET:[id number] (DIAMOX) 125 MG tablet , pt's mother does not recall how she is supposed now to give pt this medication.

## 2022-01-31 ENCOUNTER — Other Ambulatory Visit: Payer: Self-pay | Admitting: Neurology

## 2022-04-01 NOTE — Progress Notes (Deleted)
   CC:  headaches  Follow-up Visit  Last visit: 11/11/21  Brief HPI: 26 year old female with a history of developmental delay, OSA, childhood seizures, lymphedema, iron deficiency anemia who follows in clinic for IIH (2021 opening pressure 38, 09/2021 opening pressure 29).  At her last visit she was stable on Topamax 50 mg BID and Diamox 250 mg BID.  Interval History: Ophthalmology exam in May showed that her papilledema had resolved. Diamox was discontinued and Topamax was maintained at 50 mg BID.   Headache days per month: *** Headache free days per month: *** Headache severity: ***  Current Headache Regimen: Preventative: *** Abortive: ***  # of doses of abortive medications per month: ***  Prior Therapies                                  ***  Physical Exam:   Vital Signs: There were no vitals taken for this visit. GENERAL:  well appearing, in no acute distress, alert  SKIN:  Color, texture, turgor normal. No rashes or lesions HEAD:  Normocephalic/atraumatic. RESP: normal respiratory effort MSK:  No gross joint deformities.   NEUROLOGICAL: Mental Status: Alert, oriented to person, place and time, Follows commands, and Speech fluent and appropriate. Cranial Nerves: PERRL, face symmetric, no dysarthria, hearing grossly intact Motor: moves all extremities equally Gait: normal-based.  IMPRESSION: ***  PLAN: ***   Follow-up: ***  I spent a total of *** minutes on the date of the service. Headache education was done. Discussed lifestyle modification including increased oral hydration, decreased caffeine, exercise and stress management. Discussed treatment options including preventive and acute medications, natural supplements, and infusion therapy. Discussed medication overuse headache and to limit use of acute treatments to no more than 2 days/week or 10 days/month. Discussed medication side effects, adverse reactions and drug interactions. Written educational  materials and patient instructions outlining all of the above were given.  Ocie Doyne, MD

## 2022-04-02 ENCOUNTER — Ambulatory Visit: Payer: 59 | Admitting: Psychiatry

## 2022-04-02 ENCOUNTER — Encounter: Payer: Self-pay | Admitting: Psychiatry

## 2022-04-21 ENCOUNTER — Telehealth: Payer: Self-pay | Admitting: Psychiatry

## 2022-04-21 ENCOUNTER — Ambulatory Visit: Payer: 59 | Admitting: Psychiatry

## 2022-04-21 VITALS — BP 132/78 | Ht 67.0 in | Wt 305.4 lb

## 2022-04-21 DIAGNOSIS — G932 Benign intracranial hypertension: Secondary | ICD-10-CM | POA: Diagnosis not present

## 2022-04-21 NOTE — Progress Notes (Signed)
   CC:  headaches  Follow-up Visit  Last visit: 11/11/21  Brief HPI: 26 year old female with a history of developmental delay, OSA, childhood seizures, lymphedema, iron deficiency anemia who follows in clinic for IIH. Most recent LP on 10/13/21 with opening pressure of 29.  At her last visit she was continued on Topamax 50 mg BID and Diamox 250 mg BID.  Interval History: She saw ophthalmology 12/03/21 who noted her disc edema had resolved. Diamox was stopped and she was continued on Topamax 50 mg BID. She has not had any headaches or vision changes since her last visit. She is tolerating Topamax well without any side effects. She has a follow up with ophthalmology scheduled in October.   Headache days per month: 0 Headache free days per month: 30  Current Headache Regimen: Preventative: Topamax 50 mg BID Abortive: Tylenol  Prior Therapies                                  Topamax 50 mg BID Diamox 250 mg BID  Physical Exam:   Vital Signs: BP 132/78   Ht 5\' 7"  (1.702 m)   Wt (!) 305 lb 6 oz (138.5 kg)   BMI 47.83 kg/m  GENERAL:  well appearing, in no acute distress, alert  SKIN:  Color, texture, turgor normal. No rashes or lesions HEAD:  Normocephalic/atraumatic. RESP: normal respiratory effort MSK:  No gross joint deformities.   NEUROLOGICAL: Mental Status: Alert, oriented to person, place and time, Follows commands, and Speech fluent and appropriate. Cranial Nerves: PERRL, no papilledema, face symmetric, no dysarthria, hearing grossly intact Motor: moves all extremities equally Gait: normal-based.  IMPRESSION: 26 year old female with a history of developmental delay, OSA, childhood seizures, lymphedema, iron deficiency anemia who follows in clinic for IIH. Papilledema has resolved and she has been stable on Topamax 50 mg BID. Will continue this dose for now, and she will follow up with her ophthalmologist in October.   PLAN: -Continue Topamax 50 mg BID -Follow up with  Ophthalmology in October   Follow-up: 7 months  I spent a total of 19 minutes on the date of the service. Headache education was done. Discussed medication side effects, adverse reactions and drug interactions. Written educational materials and patient instructions outlining all of the above were given.  November, MD 04/21/22 8:41 am

## 2022-04-21 NOTE — Patient Instructions (Signed)
Continue Topamax 50 mg twice a day

## 2022-04-21 NOTE — Telephone Encounter (Signed)
Pt is requesting a summary of today's charges to be mailed to her, verified address on file is correct.

## 2022-09-19 ENCOUNTER — Other Ambulatory Visit: Payer: Self-pay | Admitting: Neurology

## 2022-10-29 ENCOUNTER — Ambulatory Visit: Payer: 59 | Admitting: Psychiatry

## 2022-10-29 VITALS — BP 110/80 | HR 72 | Ht 67.0 in | Wt 303.2 lb

## 2022-10-29 DIAGNOSIS — I890 Lymphedema, not elsewhere classified: Secondary | ICD-10-CM

## 2022-10-29 DIAGNOSIS — G932 Benign intracranial hypertension: Secondary | ICD-10-CM | POA: Diagnosis not present

## 2022-10-29 DIAGNOSIS — G4733 Obstructive sleep apnea (adult) (pediatric): Secondary | ICD-10-CM | POA: Diagnosis not present

## 2022-10-29 NOTE — Progress Notes (Signed)
   CC:  IIH  Follow-up Visit  Last visit: 04/21/22  Brief HPI: 27 year old female with a history of developmental delay, OSA, childhood seizures, lymphedema, iron deficiency anemia who follows in clinic for IIH. Most recent LP on 10/13/21 with opening pressure of 29.   At her last visit, papilledema had resolved and Diamox was stopped. She was continued on Topamax 50 mg BID.  Interval History: Headaches continue to be well-controlled with Topamax. She has noticed some recent issues with GI upset, but this seems to occur mostly when she eats dairy. She is planning on seeing GI for this. Vision is stable and her most recent ophthalmology visit was normal. She has another ophthalmology appointment scheduled in a couple of months.   She has been sleeping frequently during the day. Has a history of OSA and uses a CPAP her machine is old and she feels like it is no longer effective. She has not followed with anyone for OSA for several years.   Physical Exam:   Vital Signs: BP 110/80   Pulse 72   Ht 5\' 7"  (1.702 m)   Wt (!) 303 lb 3.2 oz (137.5 kg)   SpO2 98%   BMI 47.49 kg/m  GENERAL:  well appearing, in no acute distress, alert  SKIN:  Color, texture, turgor normal. No rashes or lesions HEAD:  Normocephalic/atraumatic. RESP: normal respiratory effort MSK:  No gross joint deformities.   NEUROLOGICAL: Mental Status: Alert, oriented to person, place and time, Follows commands, and Speech fluent and appropriate. Cranial Nerves: PERRL, face symmetric, no dysarthria, hearing grossly intact Motor: moves all extremities equally Gait: normal-based.  IMPRESSION: 27 year old female with a history of developmental delay, OSA, childhood seizures, lymphedema, iron deficiency anemia who presents for follow up of IIH. Her symptoms have remained well-controlled on Topamax. Suspect her GI issues are related to her diet, but discussed how Topamax can cause upset stomach and decreased appetite. May  consider trial reduction of Topamax dose if no cause for her symptoms is found by GI. Will refer to our Sleep team for management of her OSA and CPAP.  PLAN: -Continue Topamax 50 mg BID -Referral to Sleep team for OSA -Patient is requesting PT referral for lymphedema as she has been unable to reach her PCP. Referral placed for patient today, will have her follow up with PCP for further issues with her lymphedema   Follow-up: 7 months  I spent a total of 25 minutes on the date of the service. Discussed medication side effects, adverse reactions and drug interactions. Written educational materials and patient instructions outlining all of the above were given.  Genia Harold, MD 10/29/22 2:01 PM

## 2022-11-02 ENCOUNTER — Other Ambulatory Visit: Payer: Self-pay | Admitting: Psychiatry

## 2022-11-02 DIAGNOSIS — I890 Lymphedema, not elsewhere classified: Secondary | ICD-10-CM

## 2023-04-15 ENCOUNTER — Other Ambulatory Visit: Payer: Self-pay | Admitting: Psychiatry

## 2023-05-31 ENCOUNTER — Ambulatory Visit (INDEPENDENT_AMBULATORY_CARE_PROVIDER_SITE_OTHER): Payer: 59 | Admitting: Neurology

## 2023-05-31 ENCOUNTER — Encounter: Payer: Self-pay | Admitting: Neurology

## 2023-05-31 ENCOUNTER — Ambulatory Visit: Payer: 59 | Admitting: Psychiatry

## 2023-05-31 VITALS — BP 113/77 | HR 76 | Ht 67.0 in | Wt 300.0 lb

## 2023-05-31 DIAGNOSIS — G43709 Chronic migraine without aura, not intractable, without status migrainosus: Secondary | ICD-10-CM | POA: Diagnosis not present

## 2023-05-31 DIAGNOSIS — G4733 Obstructive sleep apnea (adult) (pediatric): Secondary | ICD-10-CM

## 2023-05-31 DIAGNOSIS — G932 Benign intracranial hypertension: Secondary | ICD-10-CM | POA: Diagnosis not present

## 2023-05-31 MED ORDER — SUMATRIPTAN SUCCINATE 50 MG PO TABS: 50.0000 mg | ORAL_TABLET | ORAL | 6 refills | Status: DC | PRN

## 2023-05-31 MED ORDER — TOPIRAMATE 100 MG PO TABS: 100.0000 mg | ORAL_TABLET | Freq: Two times a day (BID) | ORAL | 3 refills | Status: DC

## 2023-05-31 NOTE — Progress Notes (Signed)
Chief Complaint  Patient presents with   Follow-up    Rm 15. Accompanied by mother. IIH f/u. She reports recent headaches and nosebleeds after moving into a home with mold. She is asking to be referred for sleep study.      ASSESSMENT AND PLAN  Alicia Swanson is a 27 y.o. female   Idiopathic intracranial hypertension Increased headache with migraine features  Higher dose of topiramate 100 mg twice a day,  Imitrex as needed  Get ophthalmology evaluation from April and coming evaluation in November for comparison Obstructive sleep apnea Obesity,  Last sleep study in system was in 2018, broke down of her CPAP machine, referral to sleep specialist for repeat sleep study,  DIAGNOSTIC DATA (LABS, IMAGING, TESTING) - I reviewed patient records, labs, notes, testing and imaging myself where available.   MEDICAL HISTORY:  Alicia Swanson, is a 27 year old female accompanied by her mother follow-up for chronic headaches, idiopathic intracranial hypertension, her primary care is Dr. Yetta Barre, [place] Slater, was seen by Dr. Zada Finders in the past  History is obtained from the patient and review of electronic medical records. I personally reviewed pertinent available imaging films in PACS.   PMHx of  IIH Chronic migraine Obesity. Learning disability,  OSA, CPAP malfunctions,  Iron deficiency anemia  She was delivered around 42 weeks, was cord around her cervical, stating ICU for short period of time, developmentally delayed, finished special education for high school,  She had a frequent headaches for a while, seen by ophthalmologist Dr. Georges Mouse, confirmed papillary edema,    MRI of the brain with and without, MRI of orbit with without were normal in June and August 2021, MRV was normal  Lumbar puncture June 2021 showed opening pressure of 38 cm water Most recent lumbar puncture February 2023, open pressure was 29 cm water  Recent ophthalmology evaluation was in April 2024,  mother reported " no swelling behind her eyes", pending appointment in November 2024  Over the past few months, she complains some increased headaches, couple times each week, with light, noise sensitivity, worsening by movement, like to lie down in a dark quiet room for couple hours,  Long history of obstructive sleep apnea, last sleep study in the system was October 2018, recently her sleep machine was broken down, despite new tubes ordered, it could no longer fitting to her machine,  She is sedentary, like playing video games, tolerating Topamax 50 mg twice a day PHYSICAL EXAM:   Vitals:   05/31/23 1057  BP: 113/77  Pulse: 76  Weight: 300 lb (136.1 kg)  Height: 5\' 7"  (1.702 m)   Not recorded     Body mass index is 46.99 kg/m.  PHYSICAL EXAMNIATION:  Gen: NAD, conversant, well nourised, well groomed                     Cardiovascular: Regular rate rhythm, no peripheral edema, warm, nontender. Eyes: Conjunctivae clear without exudates or hemorrhage Neck: Supple, no carotid bruits. Pulmonary: Clear to auscultation bilaterally   NEUROLOGICAL EXAM:  MENTAL STATUS: Speech/cognition: Quiet, rely on mother to provide history, compliant with examination CRANIAL NERVES: CN II: Visual fields are full to confrontation. Pupils are round equal and briskly reactive to light. CN III, IV, VI: extraocular movement are normal. No ptosis. CN V: Facial sensation is intact to light touch CN VII: Face is symmetric with normal eye closure  CN VIII: Hearing is normal to causal conversation. CN IX, X: Phonation is normal. CN  XI: Head turning and shoulder shrug are intact  MOTOR: There is no pronator drift of out-stretched arms. Muscle bulk and tone are normal. Muscle strength is normal.  REFLEXES: Reflexes are 2+ and symmetric at the biceps, triceps, knees, and ankles. Plantar responses are flexor.  SENSORY: Intact to light touch, pinprick and vibratory sensation are intact in fingers and  toes.  COORDINATION: There is no trunk or limb dysmetria noted.  GAIT/STANCE: Push-up to get up from seated position, limited by her big body habitus  REVIEW OF SYSTEMS:  Full 14 system review of systems performed and notable only for as above All other review of systems were negative.   ALLERGIES: Allergies  Allergen Reactions   Milk-Related Compounds    Other     Enviornmental allergies: cat hair, mold, dust, mildew.   Penicillins Rash    Has patient had a PCN reaction causing immediate rash, facial/tongue/throat swelling, SOB or lightheadedness with hypotension:  Has patient had a PCN reaction causing severe rash involving mucus membranes or skin necrosis:  Has patient had a PCN reaction that required hospitalization  Has patient had a PCN reaction occurring within the last 10 years:  If all of the above answers are "NO", then may proceed with Cephalosporin use.     HOME MEDICATIONS: Current Outpatient Medications  Medication Sig Dispense Refill   ACCRUFER 30 MG CAPS Take 1 capsule by mouth 2 (two) times daily.     acetaminophen (TYLENOL) 325 MG tablet Take 975 mg by mouth every 6 (six) hours as needed for moderate pain.     ferrous sulfate 325 (65 FE) MG tablet Take 1 tablet (325 mg total) by mouth 2 (two) times daily with a meal. 60 tablet 0   topiramate (TOPAMAX) 50 MG tablet TAKE 1 TABLET BY MOUTH TWICE A DAY 180 tablet 1   UNABLE TO FIND Take 1 capsule by mouth 2 (two) times daily. Med Name: stool softner     Vitamin D, Ergocalciferol, (DRISDOL) 1.25 MG (50000 UNIT) CAPS capsule Take 50,000 Units by mouth every 7 (seven) days.     No current facility-administered medications for this visit.    PAST MEDICAL HISTORY: Past Medical History:  Diagnosis Date   Anemia    Asthma    Headache(784.0)    IIH (idiopathic intracranial hypertension)    Learning difficulty involving reading    Lymphedema of both lower extremities    [place]   OSA (obstructive sleep  apnea)    Seizures (HCC)    hasn't had a known seizure since about 2013 per pt's guardian     PAST SURGICAL HISTORY: Past Surgical History:  Procedure Laterality Date   ADENOIDECTOMY     EAR TUBE REMOVAL Bilateral 2001   HERNIA REPAIR     TONSILLECTOMY Bilateral 2001   TYMPANOSTOMY TUBE PLACEMENT Bilateral 1998    FAMILY HISTORY: Family History  Problem Relation Age of Onset   Migraines Mother    Seizures Maternal Aunt    Hypertension Maternal Grandmother    Stroke Maternal Grandfather    Asthma Brother    Asthma Sister    Pseudotumor cerebri Neg Hx     SOCIAL HISTORY: Social History   Socioeconomic History   Marital status: Single    Spouse name: Not on file   Number of children: 0   Years of education: Not on file   Highest education level: Not on file  Occupational History   Occupation: unemployed  Tobacco Use   Smoking status:  Never   Smokeless tobacco: Never  Vaping Use   Vaping status: Never Used  Substance and Sexual Activity   Alcohol use: No   Drug use: No   Sexual activity: Never    Comment: will be starting birth control 03/2020 for cycle management  Other Topics Concern   Not on file  Social History Narrative   unemployed   Home Situation: lives with mother and father and grandma   Right Handed   Spiritual Beliefs: christian   Caffeine: soda, 2-3 caffeine daily   Social Determinants of Health   Financial Resource Strain: Not on file  Food Insecurity: Not on file  Transportation Needs: Not on file  Physical Activity: Not on file  Stress: Not on file  Social Connections: Not on file  Intimate Partner Violence: Not on file      Levert Feinstein, M.D. Ph.D.  [facility] [address], [address] [place], [place] [address] Ph: [phone] Fax: [phone]  CC:  Knox Royalty, MD [address] [place],  [place] [address]  Knox Royalty, MD

## 2023-10-13 ENCOUNTER — Telehealth: Payer: Self-pay | Admitting: Neurology

## 2023-10-13 MED ORDER — TOPIRAMATE 100 MG PO TABS: 100.0000 mg | ORAL_TABLET | Freq: Two times a day (BID) | ORAL | 3 refills | Status: DC

## 2023-10-13 NOTE — Telephone Encounter (Signed)
 Medication refilled

## 2023-10-13 NOTE — Telephone Encounter (Signed)
 Pt's mother request refill for topiramate (TOPAMAX) 100 MG tablet send to  [facility] [phone]

## 2024-01-03 ENCOUNTER — Other Ambulatory Visit: Payer: Self-pay

## 2024-01-03 MED ORDER — TOPIRAMATE 100 MG PO TABS: 100.0000 mg | ORAL_TABLET | Freq: Two times a day (BID) | ORAL | 3 refills | Status: DC

## 2024-02-25 NOTE — Progress Notes (Unsigned)
 No chief complaint on file.     ASSESSMENT AND PLAN  Alicia Swanson is a 28 y.o. female   Idiopathic intracranial hypertension Increased headache with migraine features  Higher dose of topiramate  100 mg twice a day,  Imitrex  as needed  Get ophthalmology evaluation from April and coming evaluation in November for comparison Obstructive sleep apnea Obesity,  Last sleep study in system was in 2018, broke down of Alicia Swanson CPAP machine, referral to sleep specialist for repeat sleep study,  DIAGNOSTIC DATA (LABS, IMAGING, TESTING) - I reviewed patient records, labs, notes, testing and imaging myself where available.   MEDICAL HISTORY:  Update 02/28/2024 Alicia Swanson:        Consult visit 05/31/2023 Dr. Onita: Swanson HERO Alicia Swanson, is a 28 year old female accompanied by Alicia Swanson mother follow-up for chronic headaches, idiopathic intracranial hypertension, Alicia Swanson primary care is Dr. [name], Alicia Swanson, was seen by Dr. Birder in Alicia Swanson past  History is obtained from Alicia Swanson patient and review of electronic medical records. I personally reviewed pertinent available imaging films in PACS.   PMHx of  IIH Chronic migraine Obesity. Learning disability,  OSA, CPAP malfunctions,  Iron deficiency anemia  She was delivered around 42 weeks, was cord around Alicia Swanson cervical, stating ICU for short period of time, developmentally delayed, finished special education for high school,  She had a frequent headaches for a while, seen by ophthalmologist Alicia Swanson, confirmed papillary edema,    MRI of Alicia Swanson brain with and without, MRI of orbit with without were normal in June and August 2021, MRV was normal  Lumbar puncture June 2021 showed opening pressure of 38 cm water Most recent lumbar puncture February 2023, open pressure was 29 cm water  Recent ophthalmology evaluation was in April 2024, mother reported  no swelling behind Alicia Swanson eyes, pending appointment in November 2024  Over Alicia Swanson past few months, she complains  some increased headaches, couple times each week, with light, noise sensitivity, worsening by movement, like to lie down in a dark quiet room for couple hours,  Long history of obstructive sleep apnea, last sleep study in Alicia Swanson system was October 2018, recently Alicia Swanson sleep machine was broken down, despite new tubes ordered, it could no longer fitting to Alicia Swanson machine,  She is sedentary, like playing video games, tolerating Topamax  50 mg twice a day PHYSICAL EXAM:   There were no vitals filed for this visit.  Not recorded     There is no height or weight on file to calculate BMI.  PHYSICAL EXAMNIATION:  Gen: NAD, conversant, well nourised, well groomed                     Cardiovascular: Regular rate rhythm, no peripheral edema, warm, nontender. Eyes: Conjunctivae clear without exudates or hemorrhage Neck: Supple, no carotid bruits. Pulmonary: Clear to auscultation bilaterally   NEUROLOGICAL EXAM:  MENTAL STATUS: Speech/cognition: Quiet, rely on mother to provide history, compliant with examination CRANIAL NERVES: CN II: Visual fields are full to confrontation. Pupils are round equal and briskly reactive to light. CN III, IV, VI: extraocular movement are normal. No ptosis. CN V: Facial sensation is intact to light touch CN VII: Face is symmetric with normal eye closure  CN VIII: Hearing is normal to causal conversation. CN IX, X: Phonation is normal. CN XI: Head turning and shoulder shrug are intact  MOTOR: There is no pronator drift of out-stretched arms. Muscle bulk and tone are normal. Muscle strength is normal.  REFLEXES: Reflexes are 2+  and symmetric at Alicia Swanson biceps, triceps, knees, and ankles. Plantar responses are flexor.  SENSORY: Intact to light touch, pinprick and vibratory sensation are intact in fingers and toes.  COORDINATION: There is no trunk or limb dysmetria noted.  GAIT/STANCE: Push-up to get up from seated position, limited by Alicia Swanson big body habitus  REVIEW  OF SYSTEMS:  Full 14 system review of systems performed and notable only for as above All other review of systems were negative.   ALLERGIES: Allergies  Allergen Reactions   Milk-Related Compounds    Other     Enviornmental allergies: cat hair, mold, dust, mildew.   Penicillins Rash    Has patient had a PCN reaction causing immediate rash, facial/tongue/throat swelling, SOB or lightheadedness with hypotension:  Has patient had a PCN reaction causing severe rash involving mucus membranes or skin necrosis:  Has patient had a PCN reaction that required hospitalization  Has patient had a PCN reaction occurring within Alicia Swanson last 10 years:  If all of Alicia Swanson above answers are NO, then may proceed with Cephalosporin use.     HOME MEDICATIONS: Current Outpatient Medications  Medication Sig Dispense Refill   ACCRUFER 30 MG CAPS Take 1 capsule by mouth 2 (two) times daily.     acetaminophen  (TYLENOL ) 325 MG tablet Take 975 mg by mouth every 6 (six) hours as needed for moderate pain.     ferrous sulfate  325 (65 FE) MG tablet Take 1 tablet (325 mg total) by mouth 2 (two) times daily with a meal. 60 tablet 0   SUMAtriptan  (IMITREX ) 50 MG tablet Take 1 tablet (50 mg total) by mouth every 2 (two) hours as needed for migraine. May repeat in 2 hours if headache persists or recurs. 12 tablet 6   topiramate  (TOPAMAX ) 100 MG tablet Take 1 tablet (100 mg total) by mouth 2 (two) times daily. 180 tablet 3   UNABLE TO FIND Take 1 capsule by mouth 2 (two) times daily. Med Name: stool softner     Vitamin D, Ergocalciferol, (DRISDOL) 1.25 MG (50000 UNIT) CAPS capsule Take 50,000 Units by mouth every 7 (seven) days.     No current facility-administered medications for this visit.    PAST MEDICAL HISTORY: Past Medical History:  Diagnosis Date   Anemia    Asthma    Headache(784.0)    IIH (idiopathic intracranial hypertension)    Learning difficulty involving reading    Lymphedema of both lower extremities     Alicia Swanson   OSA (obstructive sleep apnea)    Seizures (HCC)    hasn't had a known seizure since about 2013 per pt's guardian     PAST SURGICAL HISTORY: Past Surgical History:  Procedure Laterality Date   ADENOIDECTOMY     EAR TUBE REMOVAL Bilateral 2001   HERNIA REPAIR     TONSILLECTOMY Bilateral 2001   TYMPANOSTOMY TUBE PLACEMENT Bilateral 1998    FAMILY HISTORY: Family History  Problem Relation Age of Onset   Migraines Mother    Seizures Maternal Aunt    Hypertension Maternal Grandmother    Stroke Maternal Grandfather    Asthma Brother    Asthma Sister    Pseudotumor cerebri Neg Hx     SOCIAL HISTORY: Social History   Socioeconomic History   Marital status: Single    Spouse name: Not on file   Number of children: 0   Years of education: Not on file   Highest education level: Not on file  Occupational History   Occupation: unemployed  Tobacco Use   Smoking status: Never   Smokeless tobacco: Never  Vaping Use   Vaping status: Never Used  Substance and Sexual Activity   Alcohol use: No   Drug use: No   Sexual activity: Never    Comment: will be starting birth control 03/2020 for cycle management  Other Topics Concern   Not on file  Social History Narrative   unemployed   Home Situation: lives with mother and father and grandma   Right Handed   Spiritual Beliefs: christian   Caffeine: soda, 2-3 caffeine daily   Social Drivers of Corporate investment banker Strain: Not on file  Food Insecurity: Not on file  Transportation Needs: Not on file  Physical Activity: Not on file  Stress: Not on file  Social Connections: Not on file  Intimate Partner Violence: Not on file      I personally spent a total of *** minutes in Alicia Swanson care of Alicia Swanson patient today including {Time Based Coding:210964241}.   Harlene Bogaert, AGNP-BC  [facility] [address] [place], [place] [address]  Phone [phone] Fax [phone] Note:  This document was prepared with digital dictation and possible smart phrase technology. Any transcriptional errors that result from this process are unintentional.

## 2024-02-28 ENCOUNTER — Ambulatory Visit (INDEPENDENT_AMBULATORY_CARE_PROVIDER_SITE_OTHER): Payer: MEDICAID | Admitting: Adult Health

## 2024-02-28 ENCOUNTER — Encounter: Payer: Self-pay | Admitting: Adult Health

## 2024-02-28 VITALS — BP 121/89 | HR 76 | Ht 67.0 in | Wt 297.0 lb

## 2024-02-28 DIAGNOSIS — E66813 Obesity, class 3: Secondary | ICD-10-CM | POA: Diagnosis not present

## 2024-02-28 DIAGNOSIS — Z6841 Body Mass Index (BMI) 40.0 and over, adult: Secondary | ICD-10-CM

## 2024-02-28 DIAGNOSIS — G4733 Obstructive sleep apnea (adult) (pediatric): Secondary | ICD-10-CM | POA: Diagnosis not present

## 2024-02-28 DIAGNOSIS — G932 Benign intracranial hypertension: Secondary | ICD-10-CM

## 2024-02-28 MED ORDER — SUMATRIPTAN SUCCINATE 50 MG PO TABS: 50.0000 mg | ORAL_TABLET | ORAL | 11 refills | Status: DC | PRN

## 2024-02-28 MED ORDER — SUMATRIPTAN SUCCINATE 50 MG PO TABS: 50.0000 mg | ORAL_TABLET | ORAL | 11 refills | Status: AC | PRN

## 2024-02-28 MED ORDER — TOPIRAMATE 100 MG PO TABS: 100.0000 mg | ORAL_TABLET | Freq: Two times a day (BID) | ORAL | 3 refills | Status: AC

## 2024-02-28 NOTE — Patient Instructions (Addendum)
 Your Plan:  Continue topamax  100mg  twice daily for headache prevention  Continue use of Imitrex  as needed for headache rescue  Follow up with your eye doctor as scheduled in October      Follow up in 6-8 months or call earlier if needed      Thank you for coming to see us  at [facility]. I hope we have been able to provide you high quality care today.  You may receive a patient satisfaction survey over the next few weeks. We would appreciate your feedback and comments so that we may continue to improve ourselves and the health of our patients.

## 2024-03-14 NOTE — Progress Notes (Signed)
 This encounter was created in error - please disregard.

## 2024-09-18 ENCOUNTER — Ambulatory Visit: Payer: MEDICAID | Admitting: Adult Health
